# Patient Record
Sex: Female | Born: 1990 | Race: Black or African American | Hispanic: No | Marital: Single | Smoking: Current every day smoker
Health system: Southern US, Community
[De-identification: ages and names within clinical notes are randomized; demographics above are authoritative.]

## PROBLEM LIST (undated history)

## (undated) DIAGNOSIS — Z8742 Personal history of other diseases of the female genital tract: Secondary | ICD-10-CM

## (undated) DIAGNOSIS — Z8619 Personal history of other infectious and parasitic diseases: Secondary | ICD-10-CM

## (undated) HISTORY — DX: Personal history of other infectious and parasitic diseases: Z86.19

## (undated) HISTORY — DX: Personal history of other diseases of the female genital tract: Z87.42

---
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
## 2012-01-03 IMAGING — US US TRANSVAGINAL NON-OB
1 series · 14 of 25 positions shown · non-contrast
Comparison: None

CLINICAL DATA: Left lower quadrant pain.

TRANSABDOMINAL AND TRANSVAGINAL ULTRASOUND OF PELVIS
TECHNIQUE: Both transabdominal and transvaginal ultrasound
examinations of the pelvis were performed including evaluation of
the uterus, ovaries, adnexal regions, and pelvic cul-de-sac.

[Series 1: us transvaginal non-ob · 0.25mm/px · 14 of 60 slices shown]
[im 1/60]
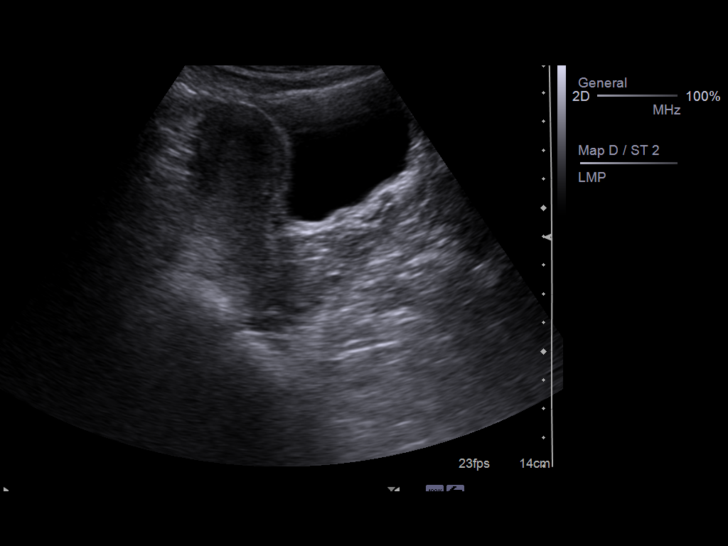
[im 5/60]
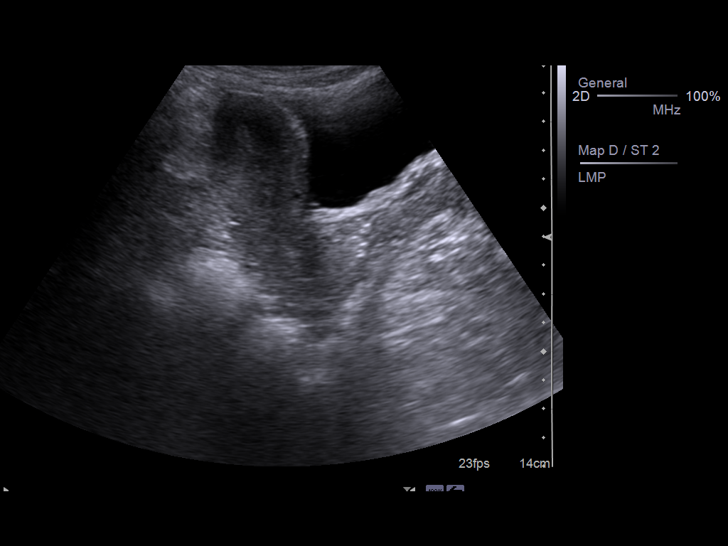
[im 10/60]
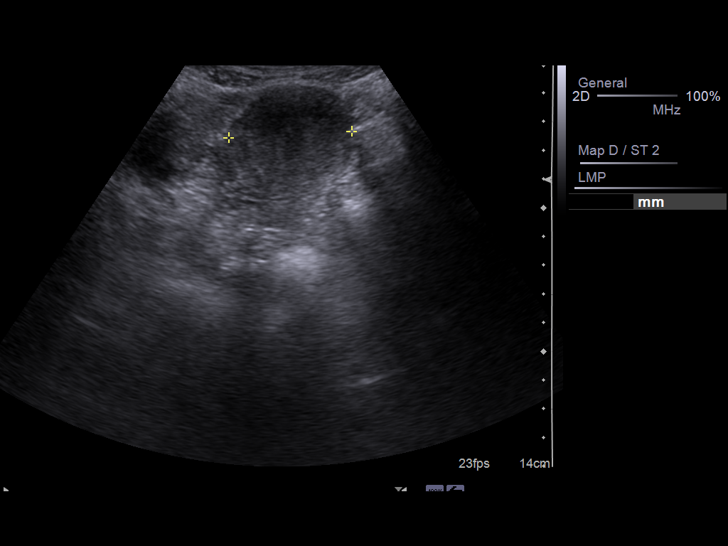
[im 15/60]
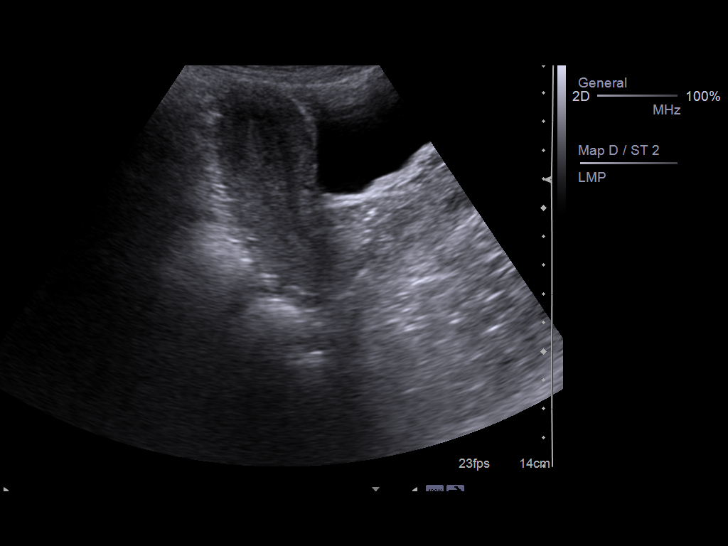
[im 20/60]
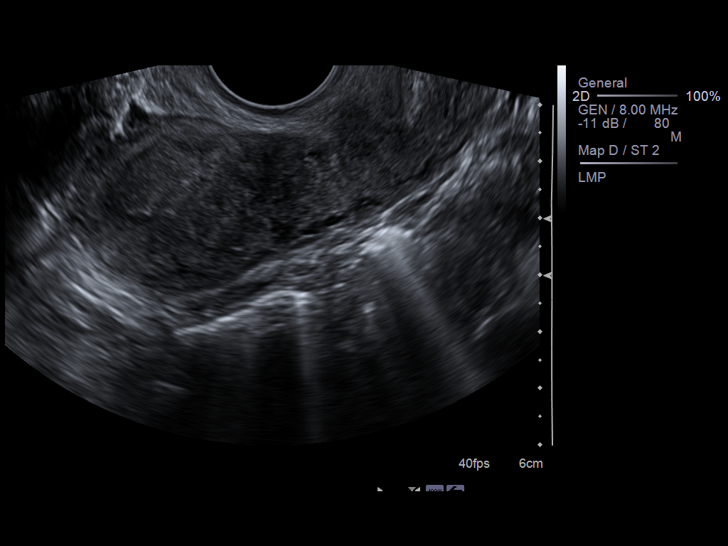
[im 23/60]
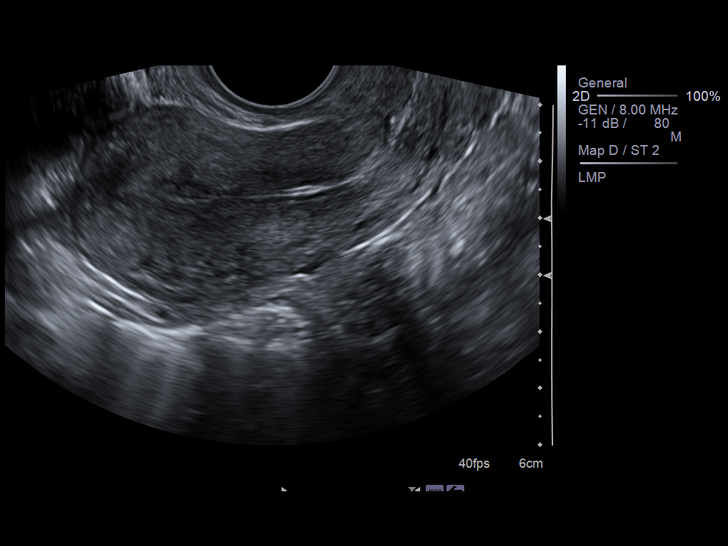
[im 28/60]
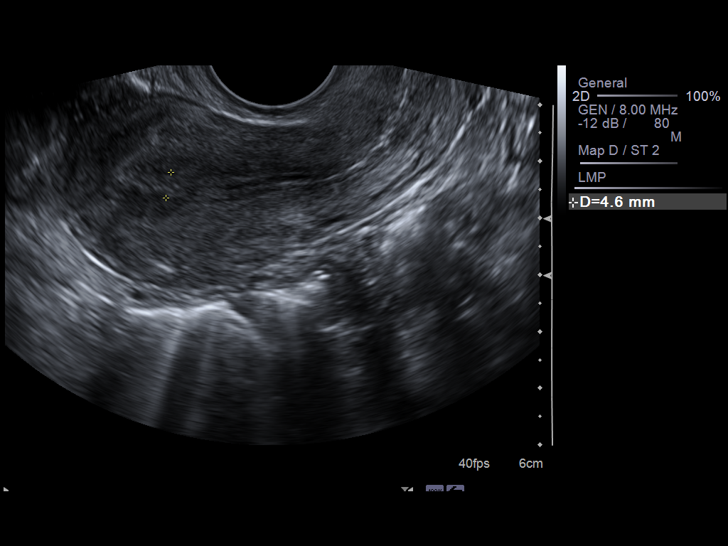
[im 32/60]
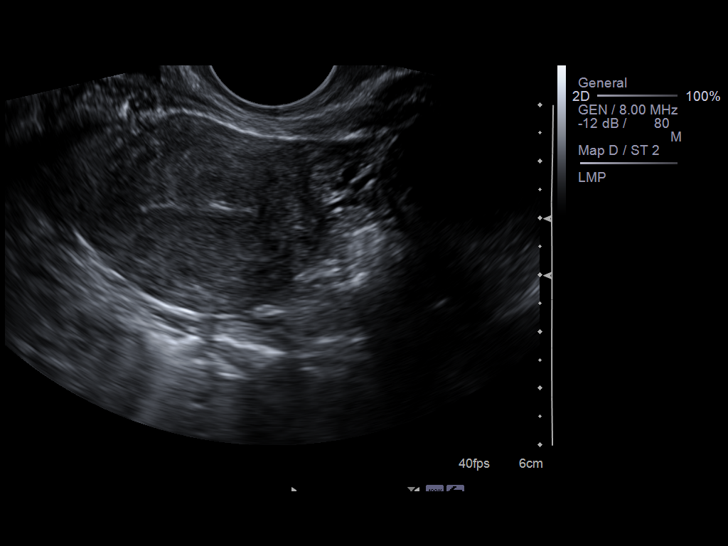
[im 37/60]
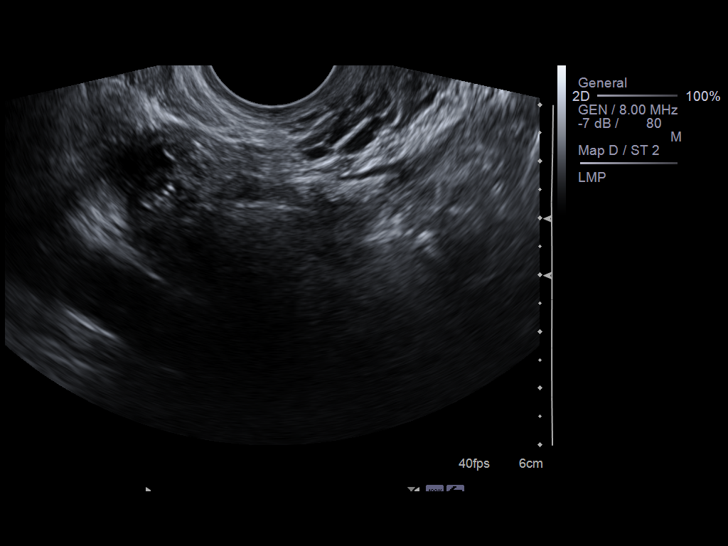
[im 40/60]
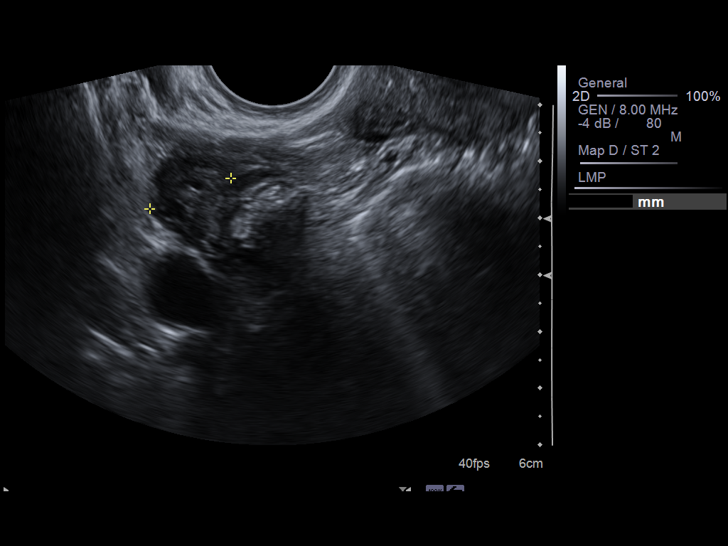
[im 45/60]
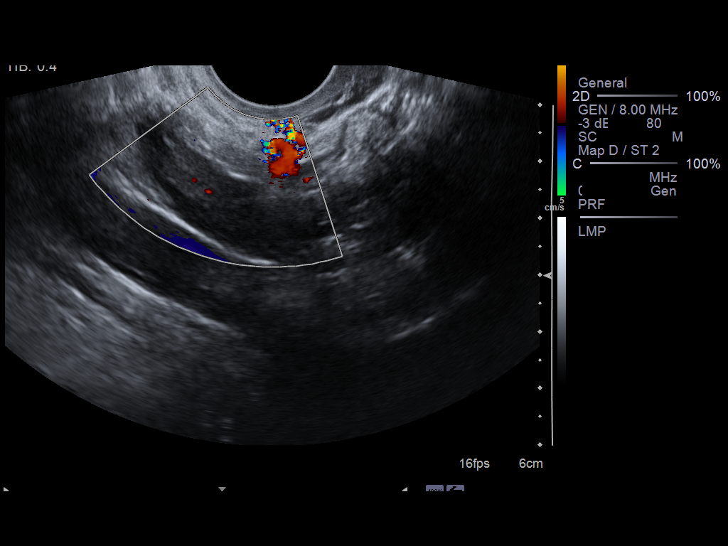
[im 50/60]
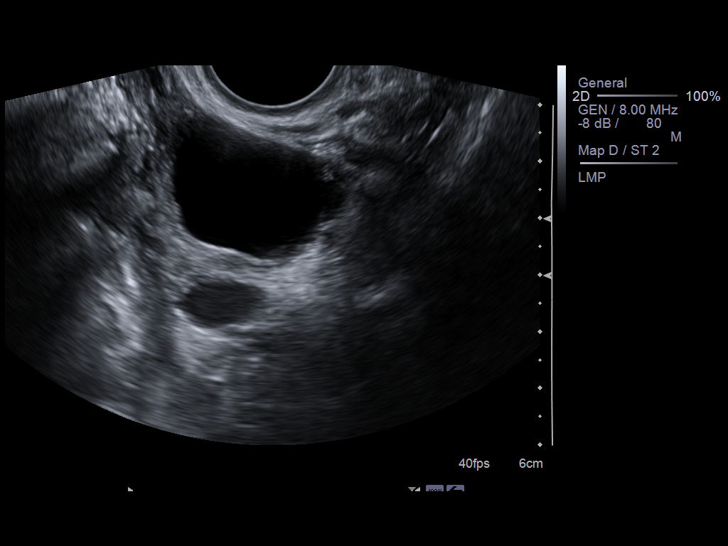
[im 55/60]
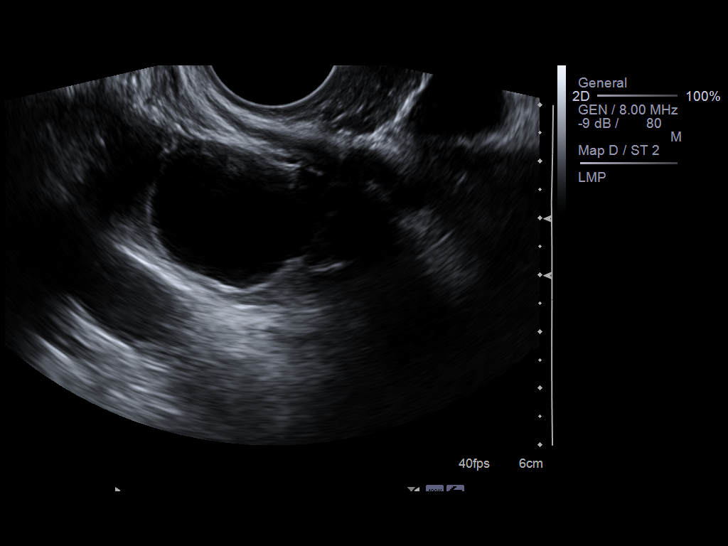
[im 60/60]
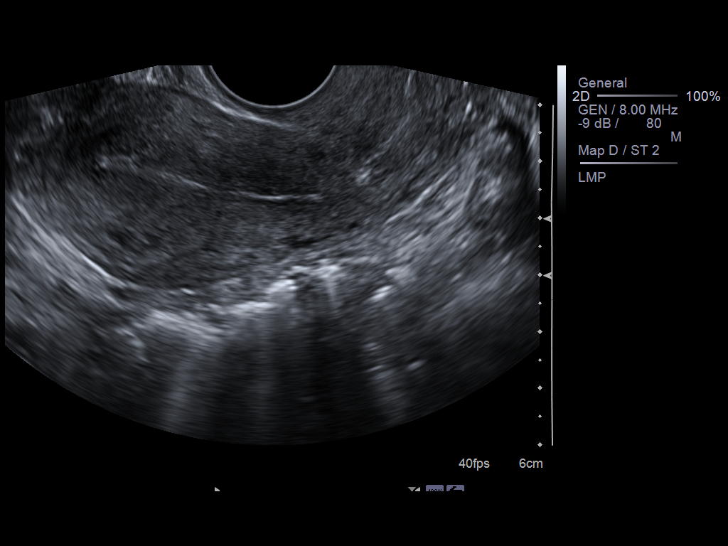

[14 of 25 positions shown; findings below may reference images not displayed]

FINDINGS: Uterus: 7.8 x 3.3 x 4.5 cm.  Normal echotexture.  No focal
abnormality.

Endometrium: Normal in thickness and appearance, 5 mm.

Right Ovary: 2.9 x 1.0 x 1.5 cm. Normal size and echotexture.  No
adnexal masses.

Left Ovary: 3.6 x 2.7 x 3.1 cm.  2.9 cm simple cyst noted in the
left ovary, likely functional cyst.

Other Findings:  Small amount of free fluid in the pelvis.
IMPRESSION: 2.9 cm simple cyst in the left ovary.

## 2015-12-21 ENCOUNTER — Encounter (HOSPITAL_COMMUNITY): Payer: Self-pay | Admitting: Emergency Medicine

## 2015-12-21 ENCOUNTER — Emergency Department (HOSPITAL_COMMUNITY)
Admission: EM | Admit: 2015-12-21 | Discharge: 2015-12-21 | Disposition: A | Discharge status: Home / Self Care | Payer: Medicaid Other | Attending: Emergency Medicine | Admitting: Emergency Medicine

## 2015-12-21 DIAGNOSIS — N764 Abscess of vulva: Secondary | ICD-10-CM | POA: Insufficient documentation

## 2015-12-21 DIAGNOSIS — F17200 Nicotine dependence, unspecified, uncomplicated: Secondary | ICD-10-CM | POA: Insufficient documentation

## 2015-12-21 DIAGNOSIS — L0292 Furuncle, unspecified: Secondary | ICD-10-CM

## 2015-12-21 DIAGNOSIS — N760 Acute vaginitis: Secondary | ICD-10-CM | POA: Insufficient documentation

## 2015-12-21 DIAGNOSIS — J45909 Unspecified asthma, uncomplicated: Secondary | ICD-10-CM | POA: Insufficient documentation

## 2015-12-21 DIAGNOSIS — B9689 Other specified bacterial agents as the cause of diseases classified elsewhere: Secondary | ICD-10-CM

## 2015-12-21 LAB — WET PREP, GENITAL
SPERM: NONE SEEN
TRICH WET PREP: NONE SEEN
WBC, Wet Prep HPF POC: NONE SEEN
Yeast Wet Prep HPF POC: NONE SEEN

## 2015-12-21 MED ORDER — METRONIDAZOLE 500 MG PO TABS: 500.0000 mg | ORAL_TABLET | Freq: Two times a day (BID) | ORAL | 0 refills | Status: DC

## 2015-12-21 NOTE — Discharge Instructions (Signed)
You were seen today for a bump near your vagina. This is likely an inflamed hair follicle. You will need to apply a warm compress/wash cloth to the area at least three times a day for 10 minutes at a time. The bump should eventually come to a head and pop like a pimple or it will just go away over the next week. You can take over the counter Tylenol as needed for pain. If the bump becomes bigger or more painful you will need to be seen at the health department or here.   We saw that you had bacterial vaginosis on your test. We have given you a 1 week treatment with Metronidazole. This is NOT an STD.    Take care!

## 2015-12-21 NOTE — ED Provider Notes (Signed)
I saw and evaluated the patient, reviewed the resident's note and I agree with the findings and plan with the following exceptions.   25 year old female with vaginal discharge even after being treated for 2 days for bacterial vaginosis. Also has a painful area right lower labial area. This consistent with folliculitis no evidence of cellulitis or abscess at this time. She will use compresses and topical treatment. We will also give her a longer course of Flagyl and have her follow-up with health Department this is an improvement to make sure is not something like herpes.   Marily MemosJason Najee Manninen, MD 12/21/15 [phone]

## 2015-12-21 NOTE — ED Triage Notes (Signed)
Pt sts 2 "knots" in her vaginal area that are painful; pt sts recent meds for BV

## 2015-12-21 NOTE — ED Provider Notes (Signed)
[facility]-EMERGENCY DEPT Provider Note   CSN: [id number] Arrival date & time: 12/21/15  [id number]   History   Chief Complaint Chief Complaint  Patient presents with  . Vaginal Pain   HPI   Christina Barber is a 25 y.o. female with PMH of asthma who presents with vaginal pain. Patient states that 2 weeks ago she had a bump on the outside of her vagina. The bump went away but then came back 1 week later. Then another bump popped up near the previous one a couple days later. She now endorses 2 bumps in the vaginal area. Has not been able to visualize them but notes no drainage from the site or blood. She has never had something like this happen before. Admits to using nair hair removal cream on her vagina intermittently. She also shaves her vagina as needed. No new soaps or douching.   Additionally, she admits that she was treated for BV, gonorrhea, and chlamydia [facility] although her tests only came back positive for BV. States she took a 2 day antibiotic course for the BV, does not remember the name of the medication. The last time she had sexual intercourse was about 2 months ago. Admits to using condoms. Only has 1 female partner. Has had "yellowish green" vaginal discharge over the last week. Denies any fevers, chills, pelvic pain, back pain, or dysuria.   Has had a cold for the last week with runny nose and cough. Denies any sore throat.   History reviewed. No pertinent past medical history.  Patient Active Problem List   Diagnosis Date Noted  . Missed period 05/18/2010  . ASTHMA, EXERCISE INDUCED 04/03/2006  . HEMATURIA 04/03/2006    History reviewed. No pertinent surgical history.  OB History    No data available      Home Medications    Prior to Admission medications   Medication Sig Start Date End Date Taking? Authorizing Provider  drospirenone-ethinyl estradiol (YASMIN) 3-0.03 MG per tablet Take 1 tablet by mouth daily.      Historical Provider, MD    metroNIDAZOLE (FLAGYL) 500 MG tablet Take 1 tablet (500 mg total) by mouth 2 (two) times daily. 12/21/15   Beaulah Dinninghristina M Jammy Stlouis, MD    Family History History reviewed. No pertinent family history.  Social History Social History  Substance Use Topics  . Smoking status: Current Every Day Smoker  . Smokeless tobacco: Never Used  . Alcohol use No     Allergies   Patient has no known allergies.   Review of Systems Review of Systems  10 Systems reviewed and are negative for acute change except as noted in the HPI.  Physical Exam Updated Vital Signs BP 131/93   Pulse 94   Temp 99.3 F (37.4 C) (Oral)   Resp 16   SpO2 100%   Physical Exam  Constitutional: She is oriented to person, place, and time. She appears well-developed and well-nourished.  HENT:  Head: Normocephalic and atraumatic.  Right Ear: External ear normal.  Left Ear: External ear normal.  Nose: Mucosal edema (bilaterally) present.  Mouth/Throat: Oropharynx is clear and moist.  Eyes: Conjunctivae and EOM are normal. Pupils are equal, round, and reactive to light. No scleral icterus.  Neck: Normal range of motion. Neck supple.  Cardiovascular: Normal rate, regular rhythm, normal heart sounds and intact distal pulses.   Pulmonary/Chest: Effort normal and breath sounds normal. No respiratory distress. She has no wheezes.  Intermittent coughing  Abdominal: Soft.  Bowel sounds are normal. She exhibits no distension. There is no tenderness.  Genitourinary: Rectum normal.     Genitourinary Comments: External genitalia within normal limits.  Vaginal mucosa pink, moist, normal rugae. Nonfriable cervix without lesions, no bleeding noted on speculum exam. Some white creamy discharge noted in vaginal vault. Bimanual exam revealed normal, nongravid uterus.  No cervical motion tenderness. No adnexal masses bilaterally.     Musculoskeletal: Normal range of motion. She exhibits no edema or tenderness.  Lymphadenopathy:     She has no cervical adenopathy.  Neurological: She is alert and oriented to person, place, and time.  Skin: Skin is warm and dry. Capillary refill takes less than 2 seconds.  Psychiatric: She has a normal mood and affect. Her behavior is normal.     ED Treatments / Results  Labs (all labs ordered are listed, but only abnormal results are displayed) Labs Reviewed  WET PREP, GENITAL - Abnormal; Notable for the following:       Result Value   Clue Cells Wet Prep HPF POC PRESENT (*)    All other components within normal limits  GC[facility]CHLAMYDIA [facility] AMP [facility]    EKG  EKG Interpretation None       Radiology No results found.  Procedures Procedures (including critical care time)  Medications Ordered in ED Medications - No data to display   Initial Impression / Assessment and Plan / ED Course  I have reviewed the triage vital signs and the nursing notes.  Pertinent labs & imaging results that were available during my care of the patient were reviewed by me and considered in my medical decision making (see chart for details).  Clinical Course    Christina HippJessica R Brar is a 25 y.o. female with PMH of asthma who presented with vaginal pain and discharge. Vital signs stable and patient afebrile. Physical exam showing ~802mm furuncle on right groin next to outer labia majora. Vaginal exam showing some white creamy discharge but otherwise normal pelvic exam. Unlikely PID. Wet prep and GC/Chlamydia testing performed. Wet prep revealing clue cells.   Discussed using warm compresses and triple antibiotic ointment on furuncle. Tylenol for pain. If lesion does not improve over the next week, will return to health department.   Will prescribe 7 day course of Metronidazole 500 mg BID for BV treatment.   Stable for discharge.    Final Clinical Impressions(s) / ED Diagnoses   Final diagnoses:  Furuncle  BV (bacterial vaginosis)    New Prescriptions New Prescriptions    METRONIDAZOLE (FLAGYL) 500 MG TABLET    Take 1 tablet (500 mg total) by mouth 2 (two) times daily.     Beaulah Dinninghristina M Linlee Cromie, MD 12/21/15 1055    Beaulah Dinninghristina M Aanshi Batchelder, MD 12/21/15 1101    Marily MemosJason Mesner, MD 12/21/15 [phone]

## 2015-12-22 LAB — GC/CHLAMYDIA PROBE AMP (~~LOC~~) NOT AT ARMC
CHLAMYDIA, DNA PROBE: NEGATIVE
NEISSERIA GONORRHEA: NEGATIVE

## 2016-07-08 ENCOUNTER — Emergency Department
Admission: EM | Admit: 2016-07-08 | Discharge: 2016-07-08 | Disposition: A | Discharge status: Home / Self Care | Payer: Medicaid Other | Attending: Emergency Medicine | Admitting: Emergency Medicine

## 2016-07-08 ENCOUNTER — Encounter: Payer: Self-pay | Admitting: Emergency Medicine

## 2016-07-08 DIAGNOSIS — B3731 Acute candidiasis of vulva and vagina: Secondary | ICD-10-CM

## 2016-07-08 DIAGNOSIS — B9689 Other specified bacterial agents as the cause of diseases classified elsewhere: Secondary | ICD-10-CM

## 2016-07-08 DIAGNOSIS — F17210 Nicotine dependence, cigarettes, uncomplicated: Secondary | ICD-10-CM | POA: Insufficient documentation

## 2016-07-08 DIAGNOSIS — N760 Acute vaginitis: Secondary | ICD-10-CM

## 2016-07-08 DIAGNOSIS — B373 Candidiasis of vulva and vagina: Secondary | ICD-10-CM | POA: Insufficient documentation

## 2016-07-08 DIAGNOSIS — J45909 Unspecified asthma, uncomplicated: Secondary | ICD-10-CM | POA: Insufficient documentation

## 2016-07-08 LAB — URINALYSIS, ROUTINE W REFLEX MICROSCOPIC
BILIRUBIN URINE: NEGATIVE
GLUCOSE, UA: NEGATIVE mg/dL
HGB URINE DIPSTICK: NEGATIVE
KETONES UR: NEGATIVE mg/dL
Leukocytes, UA: NEGATIVE
Nitrite: NEGATIVE
PH: 5 (ref 5.0–8.0)
Protein, ur: NEGATIVE mg/dL
SPECIFIC GRAVITY, URINE: 1.025 (ref 1.005–1.030)

## 2016-07-08 LAB — WET PREP, GENITAL
SPERM: NONE SEEN
TRICH WET PREP: NONE SEEN
YEAST WET PREP: NONE SEEN

## 2016-07-08 LAB — POCT PREGNANCY, URINE: Preg Test, Ur: NEGATIVE

## 2016-07-08 LAB — CHLAMYDIA/NGC RT PCR (ARMC ONLY)
Chlamydia Tr: NOT DETECTED
N gonorrhoeae: NOT DETECTED

## 2016-07-08 MED ORDER — FLUCONAZOLE 150 MG PO TABS: 150.0000 mg | ORAL_TABLET | Freq: Every day | ORAL | 0 refills | Status: DC

## 2016-07-08 MED ORDER — METRONIDAZOLE 500 MG PO TABS: 500.0000 mg | ORAL_TABLET | Freq: Two times a day (BID) | ORAL | 0 refills | Status: DC

## 2016-07-08 MED ORDER — METRONIDAZOLE 500 MG PO TABS
500.0000 mg | ORAL_TABLET | Freq: Once | ORAL | Status: AC
  Administered 2016-07-08: 500 mg via ORAL
  Filled 2016-07-08: qty 1

## 2016-07-08 NOTE — ED Notes (Signed)
See triage note  States she is having some lower abd pain with vaginal itching and discharge

## 2016-07-08 NOTE — ED Triage Notes (Signed)
Vaginal irritation x 2 days. White discharge.

## 2016-07-08 NOTE — ED Provider Notes (Signed)
[facility] Emergency Department Provider Note  ____________________________________________  Time seen: Approximately 6:42 PM  I have reviewed the triage vital signs and the nursing notes.   HISTORY  Chief Complaint Vaginal Itching    HPI Christina Barber is a 26 y.o. female who presents to the emergency department for evaluation of vaginal irritation and white discharge for the past 2 days.She states that her husband just returned from prison and Been having frequent intercourse. She states that she has some pain on the left side of her pelvis during intercourse. She has not used any over-the-counter medications for what she feels is a yeast infection. She states that she frequently gets bacterial vaginosis as well as yeast infections.  History reviewed. No pertinent past medical history.  Patient Active Problem List   Diagnosis Date Noted  . Missed period 05/18/2010  . ASTHMA, EXERCISE INDUCED 04/03/2006  . HEMATURIA 04/03/2006    History reviewed. No pertinent surgical history.  Prior to Admission medications   Medication Sig Start Date End Date Taking? Authorizing Provider  drospirenone-ethinyl estradiol (YASMIN) 3-0.03 MG per tablet Take 1 tablet by mouth daily.      [provider]  fluconazole (DIFLUCAN) 150 MG tablet Take 1 tablet (150 mg total) by mouth daily. 07/08/16   Makylie Rivere B, FNP  metroNIDAZOLE (FLAGYL) 500 MG tablet Take 1 tablet (500 mg total) by mouth 2 (two) times daily. 07/08/16   Chinita Pester, FNP    Allergies Patient has no known allergies.  No family history on file.  Social History Social History  Substance Use Topics  . Smoking status: Current Every Day Smoker    Packs/day: 1.00    Types: Cigarettes  . Smokeless tobacco: Never Used  . Alcohol use No    Review of Systems Constitutional: Negative for fever. Respiratory: Negative for shortness of breath or cough. Gastrointestinal:Negative for abdominal  pain; negative for nausea , negative for vomiting. Genitourinary: Negative for dysuria , positive for vaginal discharge. Positive for dyspareunia.  Musculoskeletal: Negative for back pain. Skin: Negative for rash, lesion, or wound.. ____________________________________________   PHYSICAL EXAM:  VITAL SIGNS: ED Triage Vitals  Enc Vitals Group     BP 07/08/16 1739 119/79     Pulse Rate 07/08/16 1739 76     Resp 07/08/16 1739 18     Temp 07/08/16 1739 98.1 F (36.7 C)     Temp Source 07/08/16 1739 Oral     SpO2 07/08/16 1739 98 %     Weight 07/08/16 1740 130 lb (59 kg)     Height 07/08/16 1740 5\' 5"  (1.651 m)     Head Circumference --      Peak Flow --      Pain Score --      Pain Loc --      Pain Edu? --      Excl. in GC? --     Constitutional: Alert and oriented. Well appearing and in no acute distress. Eyes: Conjunctivae are normal.  Head: Atraumatic. Nose: No congestion/rhinnorhea. Mouth/Throat: Mucous membranes are moist. Respiratory: Normal respiratory effort.  No retractions. Gastrointestinal: Abdomen is soft, nontender. No rebound or guarding. Negative McBurney's. Genitourinary: Pelvic exam: Copious amounts of thick, white discharge noted in the vaginal vault as well as on the exterior surface of the cervix. Yellow discharge noted at the cervical os. Mild left adnexal tenderness on exam. No cervical motion tenderness. No right adnexal tenderness on exam. Musculoskeletal: No extremity tenderness nor edema.  Neurologic:  Normal speech and language. No gross focal neurologic deficits are appreciated. Speech is normal. No gait instability. Skin:  Skin is warm, dry and intact. No rash noted. Psychiatric: Mood and affect are normal. Speech and behavior are normal.  ____________________________________________   LABS (all labs ordered are listed, but only abnormal results are displayed)  Labs Reviewed  WET PREP, GENITAL - Abnormal; Notable for the following:        Result Value   Clue Cells Wet Prep HPF POC PRESENT (*)    WBC, Wet Prep HPF POC RARE (*)    All other components within normal limits  URINALYSIS, ROUTINE W REFLEX MICROSCOPIC - Abnormal; Notable for the following:    Color, Urine YELLOW (*)    APPearance CLEAR (*)    All other components within normal limits  CHLAMYDIA/NGC RT PCR ([facility] ONLY)  POC URINE PREG, ED  POCT PREGNANCY, URINE  POC URINE PREG, ED   ____________________________________________  RADIOLOGY  Not indicated ____________________________________________   PROCEDURES  Procedure(s) performed: None  ____________________________________________  26 year old female presenting to the emergency department for evaluation and treatment of vaginal discharge. Pelvic exam reveals thick, white discharge in the vaginal vault as well as covering the cervix. Patient's significant other is also being evaluated in the emergency department and was treated empirically for chlamydia, gonorrhea, and Trichomonas. I made the recommendation to the patient that she also be treated empirically due to her symptoms, exam, and the fact that he to is here for STD screening. Patient became angry and stated that he did not need empiric treatment and that she only wanted him to be treated for yeast and bacteria. I attempted in many different ways to explain to the patient that it was unlikely that he had a yeast infection or bacterial infection. ED tech witnessed the conversation. Patient became very argumentative and I ended the conversation by telling her that I would return once the results of her wet prep are back.  Results of wet prep reviewed with the patient who will receive treatment for yeast and bacterial vaginosis. Treatment for yeast is based on clinical exam although lab did not report visualization on sample. Patient was advised that she should follow-up with the free STD clinic at the health department for additional testing and  if symptoms are not improving.  INITIAL IMPRESSION / ASSESSMENT AND PLAN / ED COURSE  Pertinent labs & imaging results that were available during my care of the patient were reviewed by me and considered in my medical decision making (see chart for details).  ____________________________________________   FINAL CLINICAL IMPRESSION(S) / ED DIAGNOSES  Final diagnoses:  Yeast vaginitis  Bacterial vaginosis    Note:  This document was prepared using Dragon voice recognition software and may include unintentional dictation errors.    Chinita Pesterriplett, Karrina Lye B, FNP 07/11/16 1926    Phineas SemenGoodman, [place], MD 07/13/16 [phone]

## 2016-08-20 ENCOUNTER — Emergency Department
Admission: EM | Admit: 2016-08-20 | Discharge: 2016-08-20 | Disposition: A | Discharge status: Home / Self Care | Payer: Self-pay | Attending: Emergency Medicine | Admitting: Emergency Medicine

## 2016-08-20 DIAGNOSIS — Z79899 Other long term (current) drug therapy: Secondary | ICD-10-CM | POA: Insufficient documentation

## 2016-08-20 DIAGNOSIS — N3001 Acute cystitis with hematuria: Secondary | ICD-10-CM

## 2016-08-20 DIAGNOSIS — F17210 Nicotine dependence, cigarettes, uncomplicated: Secondary | ICD-10-CM | POA: Insufficient documentation

## 2016-08-20 DIAGNOSIS — N3091 Cystitis, unspecified with hematuria: Secondary | ICD-10-CM | POA: Insufficient documentation

## 2016-08-20 DIAGNOSIS — J45909 Unspecified asthma, uncomplicated: Secondary | ICD-10-CM | POA: Insufficient documentation

## 2016-08-20 LAB — URINALYSIS, COMPLETE (UACMP) WITH MICROSCOPIC
BILIRUBIN URINE: NEGATIVE
Glucose, UA: NEGATIVE mg/dL
KETONES UR: NEGATIVE mg/dL
Nitrite: NEGATIVE
Protein, ur: 100 mg/dL — AB
SPECIFIC GRAVITY, URINE: 1.017 (ref 1.005–1.030)
pH: 6 (ref 5.0–8.0)

## 2016-08-20 LAB — PREGNANCY, URINE: PREG TEST UR: NEGATIVE

## 2016-08-20 MED ORDER — TRAMADOL HCL 50 MG PO TABS
50.0000 mg | ORAL_TABLET | Freq: Once | ORAL | Status: AC
  Administered 2016-08-20: 50 mg via ORAL
  Filled 2016-08-20: qty 1

## 2016-08-20 MED ORDER — KETOROLAC TROMETHAMINE 60 MG/2ML IM SOLN
30.0000 mg | Freq: Once | INTRAMUSCULAR | Status: AC
  Administered 2016-08-20: 30 mg via INTRAMUSCULAR
  Filled 2016-08-20: qty 2

## 2016-08-20 MED ORDER — NAPROXEN 500 MG PO TABS: 500.0000 mg | ORAL_TABLET | Freq: Two times a day (BID) | ORAL | Status: DC

## 2016-08-20 MED ORDER — PHENAZOPYRIDINE HCL 200 MG PO TABS: 200.0000 mg | ORAL_TABLET | Freq: Three times a day (TID) | ORAL | 0 refills | Status: DC | PRN

## 2016-08-20 MED ORDER — SULFAMETHOXAZOLE-TRIMETHOPRIM 800-160 MG PO TABS: 1.0000 | ORAL_TABLET | Freq: Two times a day (BID) | ORAL | 0 refills | Status: DC

## 2016-08-20 NOTE — ED Triage Notes (Signed)
Pt c/o burning/painful urination that started today. "I think I have a UTI"..Marland Kitchen

## 2016-08-20 NOTE — ED Provider Notes (Signed)
[facility] Emergency Department Provider Note   ____________________________________________   First MD Initiated Contact with Patient 08/20/16 1047     (approximate)  I have reviewed the triage vital signs and the nursing notes.   HISTORY  Chief Complaint Urinary Frequency    HPI Christina Barber is a 26 y.o. female patient complain of dysuria that started today. Patient also complaining of frequency and urgency. Patient believes she has a "UTI".Patient denies fever, flank pain, vaginal discharge.  No pulsus measures for complaint. Patient rates the pain as a 6/10. She described a pain as "burning".   History reviewed. No pertinent past medical history.  Patient Active Problem List   Diagnosis Date Noted  . Missed period 05/18/2010  . ASTHMA, EXERCISE INDUCED 04/03/2006  . HEMATURIA 04/03/2006    History reviewed. No pertinent surgical history.  Prior to Admission medications   Medication Sig Start Date End Date Taking? Authorizing Provider  drospirenone-ethinyl estradiol (YASMIN) 3-0.03 MG per tablet Take 1 tablet by mouth daily.      [provider]  fluconazole (DIFLUCAN) 150 MG tablet Take 1 tablet (150 mg total) by mouth daily. 07/08/16   Triplett, Cari B, FNP  metroNIDAZOLE (FLAGYL) 500 MG tablet Take 1 tablet (500 mg total) by mouth 2 (two) times daily. 07/08/16   Triplett, Rulon Eisenmengerari B, FNP  naproxen (NAPROSYN) 500 MG tablet Take 1 tablet (500 mg total) by mouth 2 (two) times daily with a meal. 08/20/16   Joni ReiningSmith, Decie Verne K, PA-C  phenazopyridine (PYRIDIUM) 200 MG tablet Take 1 tablet (200 mg total) by mouth 3 (three) times daily as needed for pain. 08/20/16   Joni ReiningSmith, Kaison Mcparland K, PA-C  sulfamethoxazole-trimethoprim (BACTRIM DS,SEPTRA DS) 800-160 MG tablet Take 1 tablet by mouth 2 (two) times daily. 08/20/16   Joni ReiningSmith, Jamin Humphries K, PA-C    Allergies Patient has no known allergies.  No family history on file.  Social History Social History    Substance Use Topics  . Smoking status: Current Every Day Smoker    Packs/day: 1.00    Types: Cigarettes  . Smokeless tobacco: Never Used  . Alcohol use No    Review of Systems  Constitutional: No fever/chills Eyes: No visual changes. ENT: No sore throat. Cardiovascular: Denies chest pain. Respiratory: Denies shortness of breath. Gastrointestinal: No abdominal pain.  No nausea, no vomiting.  No diarrhea.  No constipation. Genitourinary: Positive for dysuria. Musculoskeletal: Negative for back pain. Skin: Negative for rash. Neurological: Negative for headaches, focal weakness or numbness.   ____________________________________________   PHYSICAL EXAM:  VITAL SIGNS: ED Triage Vitals [08/20/16 1031]  Enc Vitals Group     BP      Pulse      Resp      Temp      Temp src      SpO2      Weight      Height      Head Circumference      Peak Flow      Pain Score 6     Pain Loc      Pain Edu?      Excl. in GC?     Constitutional: Alert and oriented. Well appearing and in no acute distress. Cardiovascular: Normal rate, regular rhythm. Grossly normal heart sounds.  Good peripheral circulation. Respiratory: Normal respiratory effort.  No retractions. Lungs CTAB. Gastrointestinal: Soft and nontender. No distention. No abdominal bruits. No CVA tenderness. Genitourinary: Deferred Neurologic:  Normal speech and language. No gross  focal neurologic deficits are appreciated. No gait instability. Skin:  Skin is warm, dry and intact. No rash noted. Psychiatric: Mood and affect are normal. Speech and behavior are normal.  ____________________________________________   LABS (all labs ordered are listed, but only abnormal results are displayed)  Labs Reviewed  URINALYSIS, COMPLETE (UACMP) WITH MICROSCOPIC - Abnormal; Notable for the following:       Result Value   Color, Urine YELLOW (*)    APPearance CLOUDY (*)    Hgb urine dipstick LARGE (*)    Protein, ur 100 (*)     Leukocytes, UA LARGE (*)    Bacteria, UA MANY (*)    Squamous Epithelial / LPF 0-5 (*)    All other components within normal limits  PREGNANCY, URINE  POC URINE PREG, ED   ____________________________________________  EKG   ____________________________________________  RADIOLOGY  No results found.  ____________________________________________   PROCEDURES  Procedure(s) performed: None  Procedures  Critical Care performed: No  ____________________________________________   INITIAL IMPRESSION / ASSESSMENT AND PLAN / ED COURSE  Pertinent labs & imaging results that were available during my care of the patient were reviewed by me and considered in my medical decision making (see chart for details).  Urinary tract infection. Discussed lab results with patient. Patient given discharge care instructions and a work no. [facility].      ____________________________________________   FINAL CLINICAL IMPRESSION(S) / ED DIAGNOSES  Final diagnoses:  Acute cystitis with hematuria      NEW MEDICATIONS STARTED DURING THIS VISIT:  New Prescriptions   NAPROXEN (NAPROSYN) 500 MG TABLET    Take 1 tablet (500 mg total) by mouth 2 (two) times daily with a meal.   PHENAZOPYRIDINE (PYRIDIUM) 200 MG TABLET    Take 1 tablet (200 mg total) by mouth 3 (three) times daily as needed for pain.   SULFAMETHOXAZOLE-TRIMETHOPRIM (BACTRIM DS,SEPTRA DS) 800-160 MG TABLET    Take 1 tablet by mouth 2 (two) times daily.     Note:  This document was prepared using Dragon voice recognition software and may include unintentional dictation errors.    Joni Reining, PA-C 08/20/16 1130    Rockne Menghini, MD 08/20/16 [phone]

## 2016-08-20 NOTE — ED Notes (Signed)
See triage note  States she developed dysuria and freq with some hematuria today  States she also has had some vaginal discharge yesterday

## 2016-09-05 ENCOUNTER — Encounter: Payer: Self-pay | Admitting: Certified Nurse Midwife

## 2016-09-05 ENCOUNTER — Ambulatory Visit (INDEPENDENT_AMBULATORY_CARE_PROVIDER_SITE_OTHER): Payer: Medicaid Other | Admitting: Certified Nurse Midwife

## 2016-09-05 VITALS — BP 141/90 | HR 79 | Ht 65.0 in | Wt 123.6 lb

## 2016-09-05 DIAGNOSIS — R102 Pelvic and perineal pain: Secondary | ICD-10-CM

## 2016-09-05 DIAGNOSIS — N941 Unspecified dyspareunia: Secondary | ICD-10-CM | POA: Diagnosis not present

## 2016-09-05 DIAGNOSIS — N946 Dysmenorrhea, unspecified: Secondary | ICD-10-CM

## 2016-09-05 DIAGNOSIS — N898 Other specified noninflammatory disorders of vagina: Secondary | ICD-10-CM | POA: Diagnosis not present

## 2016-09-05 LAB — POCT URINALYSIS DIPSTICK
BILIRUBIN UA: NEGATIVE
GLUCOSE UA: NEGATIVE
KETONES UA: NEGATIVE
Leukocytes, UA: NEGATIVE
Nitrite, UA: NEGATIVE
PH UA: 7 (ref 5.0–8.0)
Protein, UA: NEGATIVE
RBC UA: NEGATIVE
Spec Grav, UA: 1.01 (ref 1.010–1.025)
Urobilinogen, UA: 0.2 E.U./dL

## 2016-09-05 MED ORDER — DOXYCYCLINE HYCLATE 100 MG PO CAPS: 100.0000 mg | ORAL_CAPSULE | Freq: Two times a day (BID) | ORAL | 0 refills | Status: DC

## 2016-09-05 MED ORDER — CEFTRIAXONE SODIUM 250 MG IJ SOLR
250.0000 mg | Freq: Once | INTRAMUSCULAR | Status: AC
  Administered 2016-09-05: 250 mg via INTRAMUSCULAR

## 2016-09-05 NOTE — Patient Instructions (Addendum)
Pelvic Inflammatory Disease Pelvic inflammatory disease (PID) is an infection in some or all of the female organs. PID can be in the uterus, ovaries, fallopian tubes, or the surrounding tissues that are inside the lower belly area (pelvis). PID can lead to lasting problems if it is not treated. To check for this disease, your doctor may:  Do a physical exam.  Do blood tests, urine tests, or a pregnancy test.  Look at your vaginal discharge.  Do tests to look inside the pelvis.  Test you for other infections.  Follow these instructions at home:  Take over-the-counter and prescription medicines only as told by your doctor.  If you were prescribed an antibiotic medicine, take it as told by your doctor. Do not stop taking it even if you start to feel better.  Do not have sex until treatment is done or as told by your doctor.  Tell your sex partner if you have PID. Your partner may need to be treated.  Keep all follow-up visits as told by your doctor. This is important.  Your doctor may test you for infection again 3 months after you are treated. Contact a doctor if:  You have more fluid (discharge) coming from your vagina or fluid that is not normal.  Your pain does not improve.  You throw up (vomit).  You have a fever.  You cannot take your medicines.  Your partner has a sexually transmitted disease (STD).  You have pain when you pee (urinate). Get help right away if:  You have more belly (abdominal) or lower belly pain.  You have chills.  You are not better after 72 hours. This information is not intended to replace advice given to you by your health care provider. Make sure you discuss any questions you have with your health care provider. Document Released: 04/19/2008 Document Revised: 06/29/2015 Document Reviewed: 02/28/2014 Elsevier Interactive Patient Education  2018 [employer]. Doxycycline tablets or capsules What is this medicine? DOXYCYCLINE (dox i SYE  kleen) is a tetracycline antibiotic. It kills certain bacteria or stops their growth. It is used to treat many kinds of infections, like dental, skin, respiratory, and urinary tract infections. It also treats acne, Lyme disease, malaria, and certain sexually transmitted infections. This medicine may be used for other purposes; ask your health care provider or pharmacist if you have questions. COMMON BRAND NAME(S): Acticlate, Adoxa, Adoxa CK, Adoxa Pak, Adoxa TT, Alodox, Avidoxy, Doxal, Mondoxyne NL, Monodox, Morgidox 1x, Morgidox 1x Kit, Morgidox 2x, Morgidox 2x Kit, NutriDox, Ocudox, TARGADOX, Vibra-Tabs, Vibramycin What should I tell my health care provider before I take this medicine? They need to know if you have any of these conditions: -liver disease -long exposure to sunlight like working outdoors -stomach problems like colitis -an unusual or allergic reaction to doxycycline, tetracycline antibiotics, other medicines, foods, dyes, or preservatives -pregnant or trying to get pregnant -breast-feeding How should I use this medicine? Take this medicine by mouth with a full glass of water. Follow the directions on the prescription label. It is best to take this medicine without food, but if it upsets your stomach take it with food. Take your medicine at regular intervals. Do not take your medicine more often than directed. Take all of your medicine as directed even if you think you are better. Do not skip doses or stop your medicine early. Talk to your pediatrician regarding the use of this medicine in children. While this drug may be prescribed for selected conditions, precautions do apply. Overdosage:  If you think you have taken too much of this medicine contact a poison control center or emergency room at once. NOTE: This medicine is only for you. Do not share this medicine with others. What if I miss a dose? If you miss a dose, take it as soon as you can. If it is almost time for your next  dose, take only that dose. Do not take double or extra doses. What may interact with this medicine? -antacids -barbiturates -birth control pills -bismuth subsalicylate -carbamazepine -methoxyflurane -other antibiotics -phenytoin -vitamins that contain iron -warfarin This list may not describe all possible interactions. Give your health care provider a list of all the medicines, herbs, non-prescription drugs, or dietary supplements you use. Also tell them if you smoke, drink alcohol, or use illegal drugs. Some items may interact with your medicine. What should I watch for while using this medicine? Tell your doctor or health care professional if your symptoms do not improve. Do not treat diarrhea with over the counter products. Contact your doctor if you have diarrhea that lasts more than 2 days or if it is severe and watery. Do not take this medicine just before going to bed. It may not dissolve properly when you lay down and can cause pain in your throat. Drink plenty of fluids while taking this medicine to also help reduce irritation in your throat. This medicine can make you more sensitive to the sun. Keep out of the sun. If you cannot avoid being in the sun, wear protective clothing and use sunscreen. Do not use sun lamps or tanning beds/booths. Birth control pills may not work properly while you are taking this medicine. Talk to your doctor about using an extra method of birth control. If you are being treated for a sexually transmitted infection, avoid sexual contact until you have finished your treatment. Your sexual partner may also need treatment. Avoid antacids, aluminum, calcium, magnesium, and iron products for 4 hours before and 2 hours after taking a dose of this medicine. If you are using this medicine to prevent malaria, you should still protect yourself from contact with mosquitos. Stay in screened-in areas, use mosquito nets, keep your body covered, and use an insect  repellent. What side effects may I notice from receiving this medicine? Side effects that you should report to your doctor or health care professional as soon as possible: -allergic reactions like skin rash, itching or hives, swelling of the face, lips, or tongue -difficulty breathing -fever -itching in the rectal or genital area -pain on swallowing -redness, blistering, peeling or loosening of the skin, including inside the mouth -severe stomach pain or cramps -unusual bleeding or bruising -unusually weak or tired -yellowing of the eyes or skin Side effects that usually do not require medical attention (report to your doctor or health care professional if they continue or are bothersome): -diarrhea -loss of appetite -nausea, vomiting This list may not describe all possible side effects. Call your doctor for medical advice about side effects. You may report side effects to FDA at 1-800-FDA-1088. Where should I keep my medicine? Keep out of the reach of children. Store at room temperature, below 30 degrees C (86 degrees F). Protect from light. Keep container tightly closed. Throw away any unused medicine after the expiration date. Taking this medicine after the expiration date can make you seriously ill. NOTE: This sheet is a summary. It may not cover all possible information. If you have questions about this medicine, talk to your doctor, pharmacist,  or health care provider.  2018 Elsevier/Gold Standard (2015-02-22 17:11:22) Ceftriaxone injection What is this medicine? CEFTRIAXONE (sef try AX one) is a cephalosporin antibiotic. It is used to treat certain kinds of bacterial infections. It will not work for colds, flu, or other viral infections. This medicine may be used for other purposes; ask your health care provider or pharmacist if you have questions. COMMON BRAND NAME(S): Rocephin What should I tell my health care provider before I take this medicine? They need to know if you have  any of these conditions: -any chronic illness -bowel disease, like colitis -both kidney and liver disease -high bilirubin level in newborn patients -an unusual or allergic reaction to ceftriaxone, other cephalosporin or penicillin antibiotics, foods, dyes, or preservatives -pregnant or trying to get pregnant -breast-feeding How should I use this medicine? This medicine is injected into a muscle or infused it into a vein. It is usually given in a medical office or clinic. If you are to give this medicine you will be taught how to inject it. Follow instructions carefully. Use your doses at regular intervals. Do not take your medicine more often than directed. Do not skip doses or stop your medicine early even if you feel better. Do not stop taking except on your doctor's advice. Talk to your pediatrician regarding the use of this medicine in children. Special care may be needed. Overdosage: If you think you have taken too much of this medicine contact a poison control center or emergency room at once. NOTE: This medicine is only for you. Do not share this medicine with others. What if I miss a dose? If you miss a dose, take it as soon as you can. If it is almost time for your next dose, take only that dose. Do not take double or extra doses. What may interact with this medicine? Do not take this medicine with any of the following medications: -intravenous calcium This medicine may also interact with the following medications: -birth control pills This list may not describe all possible interactions. Give your health care provider a list of all the medicines, herbs, non-prescription drugs, or dietary supplements you use. Also tell them if you smoke, drink alcohol, or use illegal drugs. Some items may interact with your medicine. What should I watch for while using this medicine? Tell your doctor or health care professional if your symptoms do not improve or if they get worse. Do not treat  diarrhea with over the counter products. Contact your doctor if you have diarrhea that lasts more than 2 days or if it is severe and watery. If you are being treated for a sexually transmitted disease, avoid sexual contact until you have finished your treatment. Having sex can infect your sexual partner. Calcium may bind to this medicine and cause lung or kidney problems. Avoid calcium products while taking this medicine and for 48 hours after taking the last dose of this medicine. What side effects may I notice from receiving this medicine? Side effects that you should report to your doctor or health care professional as soon as possible: -allergic reactions like skin rash, itching or hives, swelling of the face, lips, or tongue -breathing problems -fever, chills -irregular heartbeat -pain when passing urine -seizures -stomach pain, cramps -unusual bleeding, bruising -unusually weak or tired Side effects that usually do not require medical attention (report to your doctor or health care professional if they continue or are bothersome): -diarrhea -dizzy, drowsy -headache -nausea, vomiting -pain, swelling, irritation where injected -stomach  upset -sweating This list may not describe all possible side effects. Call your doctor for medical advice about side effects. You may report side effects to FDA at 1-800-FDA-1088. Where should I keep my medicine? Keep out of the reach of children. Store at room temperature below 25 degrees C (77 degrees F). Protect from light. Throw away any unused vials after the expiration date. NOTE: This sheet is a summary. It may not cover all possible information. If you have questions about this medicine, talk to your doctor, pharmacist, or health care provider.  2018 Elsevier/Gold Standard (2013-08-09 09:14:54)

## 2016-09-08 LAB — URINE CULTURE

## 2016-09-09 NOTE — Progress Notes (Signed)
GYN ENCOUNTER NOTE  Subjective:       Christina Barber is a 26 y.o. 241P1001 female here for gynecologic evaluation of vaginal discharge and pelvic pain for the last few months.   Reports white, smelly vaginal discharge. Treated for BV and yeast over the last two (2) months. Also reports dysmenorrhea, dyspareunia, and pelvic pain.   History positive for STIs requiring treatment. Questions PID after Internet research.   Declines STI screening. Denies difficulty breathing or respiratory distress, chest pain, abdominal pain, vaginal bleeding, and leg pain or swelling.   Menstrual History  Period Cycle (Days): 28 Period Duration (Days): Five (5) Period Pattern: Regular Menstrual Flow: Moderate Menstrual Control: Tampon, Maxi pad Menstrual Control Change Freq (Hours): Eight (8) Dysmenorrhea: (!) Moderate Dysmenorrhea Symptoms: Cramping, Nausea, Headache, Other (Comment) (back pain, mood changes)   Gynecologic History  Patient's last menstrual period was 08/25/2016 (exact date).  Contraception: none  Obstetric History  OB History  Gravida Para Term Preterm AB Living  1 1 1     1   SAB TAB Ectopic Multiple Live Births          1    # Outcome Date GA Lbr Len/2nd Weight Sex Delivery Anes PTL Lv  1 Term 05/06/08 5853w3d  8 lb 4.8 oz (3.765 kg) M Vag-Spont  N LIV      Past Medical History:  Diagnosis Date  . Hx of abnormal cervical Pap smear   . Hx of chlamydia infection     History reviewed. No pertinent surgical history.  Current Outpatient Prescriptions on File Prior to Visit  Medication Sig Dispense Refill  . naproxen (NAPROSYN) 500 MG tablet Take 1 tablet (500 mg total) by mouth 2 (two) times daily with a meal. 20 tablet 00  . drospirenone-ethinyl estradiol (YASMIN) 3-0.03 MG per tablet Take 1 tablet by mouth daily.      . fluconazole (DIFLUCAN) 150 MG tablet Take 1 tablet (150 mg total) by mouth daily. (Patient not taking: Reported on 09/05/2016) 1 tablet 0  . metroNIDAZOLE  (FLAGYL) 500 MG tablet Take 1 tablet (500 mg total) by mouth 2 (two) times daily. (Patient not taking: Reported on 09/05/2016) 14 tablet 0  . phenazopyridine (PYRIDIUM) 200 MG tablet Take 1 tablet (200 mg total) by mouth 3 (three) times daily as needed for pain. (Patient not taking: Reported on 09/05/2016) 6 tablet 0  . sulfamethoxazole-trimethoprim (BACTRIM DS,SEPTRA DS) 800-160 MG tablet Take 1 tablet by mouth 2 (two) times daily. (Patient not taking: Reported on 09/05/2016) 20 tablet 0   No current facility-administered medications on file prior to visit.     Allergies  Allergen Reactions  . Latex Hives    Social History   Social History  . Marital status: Single    Spouse name: N/A  . Number of children: N/A  . Years of education: N/A   Occupational History  . Not on file.   Social History Main Topics  . Smoking status: Current Every Day Smoker    Packs/day: 1.00    Types: Cigarettes  . Smokeless tobacco: Never Used  . Alcohol use No  . Drug use: No  . Sexual activity: Yes    Partners: Male    Birth control/ protection: None   Other Topics Concern  . Not on file   Social History Narrative  . No narrative on file    Family History  Problem Relation Age of Onset  . Asthma Mother   . Migraines Mother   .  Rashes / Skin problems Mother   . Diabetes Maternal Grandmother   . Hyperlipidemia Maternal Grandmother   . Hypertension Maternal Grandmother   . Thyroid disease Maternal Grandmother     The following portions of the patient's history were reviewed and updated as appropriate: allergies, current medications, past family history, past medical history, past social history, past surgical history and problem list.  Review of Systems  Review of Systems - Negative except as noted above.  History obtained from the patient.   Objective:   BP (!) 141/90 Comment: left  Pulse 79   Ht 5\' 5"  (1.651 m)   Wt 123 lb 9.6 oz (56.1 kg)   LMP 08/25/2016 (Exact Date)   BMI  20.57 kg/m   CONSTITUTIONAL: Well-developed, well-nourished female in no acute distress.   HENT:  Normocephalic, atraumatic.    SKIN: Skin is warm and dry. No rash noted. Not diaphoretic. No erythema. No pallor.  NEUROLGIC: Alert and oriented to person, place, and time.   PSYCHIATRIC: Normal mood and affect. Normal behavior. Normal judgment and thought content.  ABDOMEN: Soft, non distended; Non tender.  No Organomegaly.  PELVIC:  External Genitalia: Normal  Vagina: Normal  Cervix: CMT present  Uterus: Normal size, shape,consistency, mobile  Adnexa: Normal   MUSCULOSKELETAL: Normal range of motion. No tenderness.  No cyanosis, clubbing, or edema.  Assessment:   1. Dyspareunia in female  - NuSwab Vaginitis Plus (VG+) - Urine Culture - US Transvaginal Non-OB; Future  2. Pelvic pain  - NuSwab Vaginitis Plus (VG+) - Urine Culture - US Transvaginal Non-OB; Future - POCT urinalysis dipstick  3. Vaginal discharge  - NuSwab Vaginitis Plus (VG+)  4. Dysmenorrhea  - NuSwab Vaginitis Plus (VG+)   Plan:   Education regarding PID symptoms and treatment.   Rx Doxycycline and Rocephin, see orders.   Reviewed red flag symptoms and when to call.   RTC x 1-2 weeks for ultrasound and results review.    Gunnar Bulla, CNM

## 2016-09-12 LAB — NUSWAB VAGINITIS PLUS (VG+)
CHLAMYDIA TRACHOMATIS, NAA: NEGATIVE
Candida albicans, NAA: NEGATIVE
Candida glabrata, NAA: NEGATIVE
NEISSERIA GONORRHOEAE, NAA: NEGATIVE
Trich vag by NAA: NEGATIVE

## 2016-09-16 ENCOUNTER — Ambulatory Visit (INDEPENDENT_AMBULATORY_CARE_PROVIDER_SITE_OTHER): Payer: Medicaid Other

## 2016-09-16 DIAGNOSIS — R102 Pelvic and perineal pain: Secondary | ICD-10-CM

## 2016-09-16 DIAGNOSIS — N941 Unspecified dyspareunia: Secondary | ICD-10-CM

## 2016-09-23 ENCOUNTER — Encounter: Payer: Medicaid Other | Admitting: Certified Nurse Midwife

## 2016-09-26 ENCOUNTER — Encounter: Payer: Medicaid Other | Admitting: Certified Nurse Midwife

## 2016-09-30 ENCOUNTER — Encounter: Payer: Self-pay | Admitting: Certified Nurse Midwife

## 2016-09-30 ENCOUNTER — Ambulatory Visit (INDEPENDENT_AMBULATORY_CARE_PROVIDER_SITE_OTHER): Payer: Medicaid Other | Admitting: Certified Nurse Midwife

## 2016-09-30 VITALS — BP 135/89 | HR 71 | Ht 65.0 in | Wt 127.5 lb

## 2016-09-30 DIAGNOSIS — N83299 Other ovarian cyst, unspecified side: Secondary | ICD-10-CM

## 2016-09-30 DIAGNOSIS — L818 Other specified disorders of pigmentation: Secondary | ICD-10-CM

## 2016-09-30 DIAGNOSIS — Z09 Encounter for follow-up examination after completed treatment for conditions other than malignant neoplasm: Secondary | ICD-10-CM | POA: Diagnosis not present

## 2016-09-30 NOTE — Progress Notes (Signed)
GYN ENCOUNTER NOTE  Subjective:       Christina Barber is a 26 y.o. G3P1001 female here for results review and follow up visit.   Reports persistent pain with movement and intercourse.   Patient was originally seen by myself on 09/05/2016 for dyspareunia, dysmenorrhea, pelvic pain, and vaginal discharge.   Requests Hepatitis C testing today due to history of unprofessional tattoos.   Denies difficulty breathing or respiratory distress, chest pain, dysuria, and leg pain or swelling.    Gynecologic History  Patient's last menstrual period was 09/23/2016 (exact date).  Contraception: none  Last Pap: Overdue.   Obstetric History  OB History  Gravida Para Term Preterm AB Living  1 1 1     1   SAB TAB Ectopic Multiple Live Births          1    # Outcome Date GA Lbr Len/2nd Weight Sex Delivery Anes PTL Lv  1 Term 05/06/08 100w3d  8 lb 4.8 oz (3.765 kg) M Vag-Spont  N LIV      Past Medical History:  Diagnosis Date  . Hx of abnormal cervical Pap smear   . Hx of chlamydia infection     History reviewed. No pertinent surgical history.  No current outpatient prescriptions on file prior to visit.   No current facility-administered medications on file prior to visit.     Allergies  Allergen Reactions  . Latex Hives    Social History   Social History  . Marital status: Single    Spouse name: N/A  . Number of children: N/A  . Years of education: N/A   Occupational History  . Not on file.   Social History Main Topics  . Smoking status: Current Every Day Smoker    Packs/day: 1.00    Types: Cigarettes  . Smokeless tobacco: Never Used  . Alcohol use No  . Drug use: No  . Sexual activity: Yes    Partners: Male    Birth control/ protection: None   Other Topics Concern  . Not on file   Social History Narrative  . No narrative on file    Family History  Problem Relation Age of Onset  . Asthma Mother   . Migraines Mother   . Rashes / Skin problems Mother   .  Diabetes Maternal Grandmother   . Hyperlipidemia Maternal Grandmother   . Hypertension Maternal Grandmother   . Thyroid disease Maternal Grandmother     The following portions of the patient's history were reviewed and updated as appropriate: allergies, current medications, past family history, past medical history, past social history, past surgical history and problem list.  Review of Systems  Review of Systems - Negative except as noted above.  History obtained from the patient.   Objective:   BP 135/89   Pulse 71   Ht 5\' 5"  (1.651 m)   Wt 127 lb 8 oz (57.8 kg)   LMP 09/23/2016 (Exact Date)   BMI 21.22 kg/m   Alert and oriented x 4, no apparent distress.   ULTRASOUND REPORT  Location: [facility] Date of Service: 09/16/16   Indications: Pelvic pain and Dyspareunia Findings:  The uterus is anteverted and  measures 9.6 x 4.6 x 5.9 cm. Echo texture is overall without evidence of focal masses, except for a small area on the anterior fundus. At the anterior uterine fundus, there is a suspected fibroid measuring: Fibroid 1: 1.0 x 0.7 x 1.1 cm. This is subserosal.  The Endometrium  is thickened and measures 11.0 mm.  Right Ovary measures 4.2 x 3.6 x 4.0 cm. There is a complex mass that is partly cystic and partly solid. There is flow seen in the solid component of the mass.   Left Ovary measures 3.7 x 2.1 x 2.6 cm. It appears to be WNL. Survey of the adnexa demonstrates no adnexal masses. There is no free fluid in the cul de sac.  Impression: 1. Anterior fundal fibroid, subserosal.  2. Right complex ovarian mass, cystic with solid components with internal blood flow seen. (Patient states that she has just finished antibiotics this past Friday, 09/13/16, for P.I.D. )   Recommendations: 1.Clinical correlation with the patient's History and Physical Exam. 2. Patient has a follow up appointment on 09/26/16 at 4:00 pm with Serafina Royals,  CNM.    Assessment:   1. Complex ovarian cyst  2. Follow up  3. History of being tatooed  Plan:   Discussed cyst management options including expectant management, OCPs, and referral to MD. Decision for MD referral.   Reviewed red flag symptoms and when to call.   RTC x 2 weeks for follow up with MD.    Gunnar Bulla, CNM

## 2016-09-30 NOTE — Patient Instructions (Signed)
Ovarian Cyst °An ovarian cyst is a fluid-filled sac on an ovary. The ovaries are organs that make eggs in women. Most ovarian cysts go away on their own and are not cancerous (are benign). Some cysts need treatment. °Follow these instructions at home: °· Take over-the-counter and prescription medicines only as told by your doctor. °· Do not drive or use heavy machinery while taking prescription pain medicine. °· Get pelvic exams and Pap tests as often as told by your doctor. °· Return to your normal activities as told by your doctor. Ask your doctor what activities are safe for you. °· Do not use any products that contain nicotine or tobacco, such as cigarettes and e-cigarettes. If you need help quitting, ask your doctor. °· Keep all follow-up visits as told by your doctor. This is important. °Contact a doctor if: °· Your periods are: °¨ Late. °¨ Irregular. °¨ Painful. °· Your periods stop. °· You have pelvic pain that does not go away. °· You have pressure on your bladder. °· You have trouble making your bladder empty when you pee (urinate). °· You have pain during sex. °· You have any of the following in your belly (abdomen): °¨ A feeling of fullness. °¨ Pressure. °¨ Discomfort. °¨ Pain that does not go away. °¨ Swelling. °· You feel sick most of the time. °· You have trouble pooping (have constipation). °· You are not as hungry as usual (you lose your appetite). °· You get very bad acne. °· You start to have more hair on your body and face. °· You are gaining weight or losing weight without changing your exercise and eating habits. °· You think you may be pregnant. °Get help right away if: °· You have belly pain that is very bad or gets worse. °· You cannot eat or drink without throwing up (vomiting). °· You suddenly get a fever. °· Your period is a lot heavier than usual. °This information is not intended to replace advice given to you by your health care provider. Make sure you discuss any questions you have  with your health care provider. °Document Released: 07/10/2007 Document Revised: 08/11/2015 Document Reviewed: 06/25/2015 °Elsevier Interactive Patient Education © 2017 [employer] ° °

## 2016-10-02 LAB — CBC
Hematocrit: 38.6 % (ref 34.0–46.6)
Hemoglobin: 12 g/dL (ref 11.1–15.9)
MCH: 25.3 pg — AB (ref 26.6–33.0)
MCHC: 31.1 g/dL — AB (ref 31.5–35.7)
MCV: 81 fL (ref 79–97)
Platelets: 244 10*3/uL (ref 150–379)
RBC: 4.74 x10E6/uL (ref 3.77–5.28)
RDW: 15.4 % (ref 12.3–15.4)
WBC: 3.9 10*3/uL (ref 3.4–10.8)

## 2016-10-02 LAB — HEPATITIS C ANTIBODY

## 2016-10-08 ENCOUNTER — Encounter: Payer: Medicaid Other | Admitting: Obstetrics and Gynecology

## 2016-10-10 ENCOUNTER — Encounter: Payer: Medicaid Other | Admitting: Obstetrics and Gynecology

## 2016-10-11 ENCOUNTER — Encounter: Payer: Self-pay | Admitting: Obstetrics and Gynecology

## 2016-10-11 ENCOUNTER — Ambulatory Visit (INDEPENDENT_AMBULATORY_CARE_PROVIDER_SITE_OTHER): Payer: Medicaid Other | Admitting: Obstetrics and Gynecology

## 2016-10-11 VITALS — BP 124/84 | HR 73 | Ht 65.0 in | Wt 122.0 lb

## 2016-10-11 DIAGNOSIS — R102 Pelvic and perineal pain: Secondary | ICD-10-CM

## 2016-10-11 DIAGNOSIS — N83299 Other ovarian cyst, unspecified side: Secondary | ICD-10-CM

## 2016-10-11 NOTE — Progress Notes (Signed)
HPI:      Christina Barber is a 26 y.o. G1P1001 who LMP was Patient's last menstrual period was 09/23/2016 (exact date).  Subjective:   She presents today To discuss her chronic pelvic pain. She states that she has been having pain over several years and it is getting worse and worse. It is mainly pain with intercourse and mainly right sided. She recently had an ultrasound which revealed a complex right ovarian cyst. The patient has had multiple STDs in the past and states that she thinks she had chlamydia for 2 years without treatment at one point. She is adamant that she wants surgery for her pain. It has affected her enough and is disabling enough that her request for surgical exploration is reasonable. She is also considering pregnancy in the near future and would like to know if her tubes are open.    Hx: The following portions of the patient's history were reviewed and updated as appropriate:             She  has a past medical history of abnormal cervical Pap smear and chlamydia infection. She  does not have any pertinent problems on file. She  has no past surgical history on file. Her family history includes Asthma in her mother; Diabetes in her maternal grandmother; Hyperlipidemia in her maternal grandmother; Hypertension in her maternal grandmother; Migraines in her mother; Rashes / Skin problems in her mother; Thyroid disease in her maternal grandmother. She  reports that she has been smoking Cigarettes.  She has been smoking about 1.00 pack per day. She has never used smokeless tobacco. She reports that she does not drink alcohol or use drugs. She is allergic to latex.       Review of Systems:  Review of Systems  Constitutional: Denied constitutional symptoms, night sweats, recent illness, fatigue, fever, insomnia and weight loss.  Eyes: Denied eye symptoms, eye pain, photophobia, vision change and visual disturbance.  Ears/Nose/Throat/Neck: Denied ear, nose, throat or neck  symptoms, hearing loss, nasal discharge, sinus congestion and sore throat.  Cardiovascular: Denied cardiovascular symptoms, arrhythmia, chest pain/pressure, edema, exercise intolerance, orthopnea and palpitations.  Respiratory: Denied pulmonary symptoms, asthma, pleuritic pain, productive sputum, cough, dyspnea and wheezing.  Gastrointestinal: Denied, gastro-esophageal reflux, melena, nausea and vomiting.  Genitourinary: See HPI for additional information.  Musculoskeletal: Denied musculoskeletal symptoms, stiffness, swelling, muscle weakness and myalgia.  Dermatologic: Denied dermatology symptoms, rash and scar.  Neurologic: Denied neurology symptoms, dizziness, headache, neck pain and syncope.  Psychiatric: Denied psychiatric symptoms, anxiety and depression.  Endocrine: Denied endocrine symptoms including hot flashes and night sweats.   Meds:   No current outpatient prescriptions on file prior to visit.   No current facility-administered medications on file prior to visit.     Objective:     Vitals:   10/11/16 0938  BP: 124/84  Pulse: 73              Ultrasound results reviewed directly with the patient  Assessment:    G1P1001 Patient Active Problem List   Diagnosis Date Noted  . Missed period 05/18/2010  . ASTHMA, EXERCISE INDUCED 04/03/2006  . HEMATURIA 04/03/2006     1. Pelvic pain in female   2. Complex ovarian cyst     Patient with chronic pelvic pain. Possibly pelvic adhesive disease. Patient specifically would like exploration of the right adnexa and oophorectomy. She would like this despite the fact that it may slightly decrease her fertility. She is also strongly considering  chromopertubation.   Plan:            1.  Patient to return for preop. Orders No orders of the defined types were placed in this encounter.   No orders of the defined types were placed in this encounter.     F/U  Return in about 1 week (around 10/18/2016). I spent 22 minutes with  this patient of which greater than 50% was spent discussing chronic pelvic pain, STD history, causes of pelvic pain, pelvic adhesive disease, endometrioma, teratoma, hemorrhagic cyst, future fertility.  Elonda Husky, M.D. 10/11/2016 10:36 AM

## 2016-10-30 ENCOUNTER — Encounter: Payer: Medicaid Other | Admitting: Obstetrics and Gynecology

## 2016-11-12 ENCOUNTER — Encounter: Payer: Medicaid Other | Admitting: Obstetrics and Gynecology

## 2016-11-13 ENCOUNTER — Telehealth: Payer: Self-pay | Admitting: Obstetrics and Gynecology

## 2016-11-13 ENCOUNTER — Encounter: Payer: Self-pay | Admitting: Obstetrics and Gynecology

## 2016-11-13 ENCOUNTER — Ambulatory Visit (INDEPENDENT_AMBULATORY_CARE_PROVIDER_SITE_OTHER): Payer: Medicaid Other | Admitting: Obstetrics and Gynecology

## 2016-11-13 VITALS — BP 117/81 | HR 96 | Ht 65.0 in | Wt 125.6 lb

## 2016-11-13 DIAGNOSIS — Z113 Encounter for screening for infections with a predominantly sexual mode of transmission: Secondary | ICD-10-CM | POA: Diagnosis not present

## 2016-11-13 DIAGNOSIS — R102 Pelvic and perineal pain: Secondary | ICD-10-CM | POA: Diagnosis not present

## 2016-11-13 DIAGNOSIS — N83202 Unspecified ovarian cyst, left side: Secondary | ICD-10-CM | POA: Diagnosis not present

## 2016-11-13 LAB — POCT URINALYSIS DIPSTICK
BILIRUBIN UA: NEGATIVE
Glucose, UA: NEGATIVE
KETONES UA: NEGATIVE
LEUKOCYTES UA: NEGATIVE
Nitrite, UA: NEGATIVE
Protein, UA: NEGATIVE
Spec Grav, UA: 1.01 (ref 1.010–1.025)
Urobilinogen, UA: 0.2 E.U./dL
pH, UA: 6.5 (ref 5.0–8.0)

## 2016-11-13 MED ORDER — NORETHINDRONE ACET-ETHINYL EST 1.5-30 MG-MCG PO TABS: 1.0000 | ORAL_TABLET | Freq: Every day | ORAL | 0 refills | Status: DC

## 2016-11-13 MED ORDER — IBUPROFEN 800 MG PO TABS
800.0000 mg | ORAL_TABLET | Freq: Three times a day (TID) | ORAL | 1 refills | Status: DC | PRN
Start: 2016-11-13 — End: 2017-05-22

## 2016-11-13 MED ORDER — TAPENTADOL HCL ER 50 MG PO TB12: 50.0000 mg | ORAL_TABLET | Freq: Two times a day (BID) | ORAL | 0 refills | Status: DC

## 2016-11-13 NOTE — Patient Instructions (Addendum)
Ovarian Cyst An ovarian cyst is a fluid-filled sac that forms on an ovary. The ovaries are small organs that produce eggs in women. Various types of cysts can form on the ovaries. Some may cause symptoms and require treatment. Most ovarian cysts go away on their own, are not cancerous (are benign), and do not cause problems. Common types of ovarian cysts include:  Functional (follicle) cysts. ? Occur during the menstrual cycle, and usually go away with the next menstrual cycle if you do not get pregnant. ? Usually cause no symptoms.  Endometriomas. ? Are cysts that form from the tissue that lines the uterus (endometrium). ? Are sometimes called "chocolate cysts" because they become filled with blood that turns brown. ? Can cause pain in the lower abdomen during intercourse and during your period.  Cystadenoma cysts. ? Develop from cells on the outside surface of the ovary. ? Can get very large and cause lower abdomen pain and pain with intercourse. ? Can cause severe pain if they twist or break open (rupture).  Dermoid cysts. ? Are sometimes found in both ovaries. ? May contain different kinds of body tissue, such as skin, teeth, hair, or cartilage. ? Usually do not cause symptoms unless they get very big.  Theca lutein cysts. ? Occur when too much of a certain hormone (human chorionic gonadotropin) is produced and overstimulates the ovaries to produce an egg. ? Are most common after having procedures used to assist with the conception of a baby (in vitro fertilization).  What are the causes? Ovarian cysts may be caused by:  Ovarian hyperstimulation syndrome. This is a condition that can develop from taking fertility medicines. It causes multiple large ovarian cysts to form.  Polycystic ovarian syndrome (PCOS). This is a common hormonal disorder that can cause ovarian cysts, as well as problems with your period or fertility.  What increases the risk? The following factors may make  you more likely to develop ovarian cysts:  Being overweight or obese.  Taking fertility medicines.  Taking certain forms of hormonal birth control.  Smoking.  What are the signs or symptoms? Many ovarian cysts do not cause symptoms. If symptoms are present, they may include:  Pelvic pain or pressure.  Pain in the lower abdomen.  Pain during sex.  Abdominal swelling.  Abnormal menstrual periods.  Increasing pain with menstrual periods.  How is this diagnosed? These cysts are commonly found during a routine pelvic exam. You may have tests to find out more about the cyst, such as:  Ultrasound.  X-ray of the pelvis.  CT scan.  MRI.  Blood tests.  How is this treated? Many ovarian cysts go away on their own without treatment. Your health care provider may want to check your cyst regularly for 2-3 months to see if it changes. If you are in menopause, it is especially important to have your cyst monitored closely because menopausal women have a higher rate of ovarian cancer. When treatment is needed, it may include:  Medicines to help relieve pain.  A procedure to drain the cyst (aspiration).  Surgery to remove the whole cyst.  Hormone treatment or birth control pills. These methods are sometimes used to help dissolve a cyst.  Follow these instructions at home:  Take over-the-counter and prescription medicines only as told by your health care provider.  Do not drive or use heavy machinery while taking prescription pain medicine.  Get regular pelvic exams and Pap tests as often as told by your health care   provider.  Return to your normal activities as told by your health care provider. Ask your health care provider what activities are safe for you.  Do not use any products that contain nicotine or tobacco, such as cigarettes and e-cigarettes. If you need help quitting, ask your health care provider.  Keep all follow-up visits as told by your health care provider.  This is important. Contact a health care provider if:  Your periods are late, irregular, or painful, or they stop.  You have pelvic pain that does not go away.  You have pressure on your bladder or trouble emptying your bladder completely.  You have pain during sex.  You have any of the following in your abdomen: ? A feeling of fullness. ? Pressure. ? Discomfort. ? Pain that does not go away. ? Swelling.  You feel generally ill.  You become constipated.  You lose your appetite.  You develop severe acne.  You start to have more body hair and facial hair.  You are gaining weight or losing weight without changing your exercise and eating habits.  You think you may be pregnant. Get help right away if:  You have abdominal pain that is severe or gets worse.  You cannot eat or drink without vomiting.  You suddenly develop a fever.  Your menstrual period is much heavier than usual. This information is not intended to replace advice given to you by your health care provider. Make sure you discuss any questions you have with your health care provider. Document Released: 01/21/2005 Document Revised: 08/11/2015 Document Reviewed: 06/25/2015 Elsevier Interactive Patient Education  2018 [employer] Pelvic Pain, Female Pelvic pain is pain in your lower abdomen, below your belly button and between your hips. The pain may start suddenly (acute), keep coming back (recurring), or last a long time (chronic). Pelvic pain that lasts longer than six months is considered chronic. Pelvic pain may affect your:  Reproductive organs.  Urinary system.  Digestive tract.  Musculoskeletal system.  There are many potential causes of pelvic pain. Sometimes, the pain can be a result of digestive or urinary conditions, strained muscles or ligaments, or even reproductive conditions. Sometimes the cause of pelvic pain is not known. Follow these instructions at home:  Take over-the-counter and  prescription medicines only as told by your health care provider.  Rest as told by your health care provider.  Do not have sex it if hurts.  Keep a journal of your pelvic pain. Write down: ? When the pain started. ? Where the pain is located. ? What seems to make the pain better or worse, such as food or your menstrual cycle. ? Any symptoms you have along with the pain.  Keep all follow-up visits as told by your health care provider. This is important. Contact a health care provider if:  Medicine does not help your pain.  Your pain comes back.  You have new symptoms.  You have abnormal vaginal discharge or bleeding, including bleeding after menopause.  You have a fever or chills.  You are constipated.  You have blood in your urine or stool.  You have foul-smelling urine.  You feel weak or lightheaded. Get help right away if:  You have sudden severe pain.  Your pain gets steadily worse.  You have severe pain along with fever, nausea, vomiting, or excessive sweating.  You lose consciousness. This information is not intended to replace advice given to you by your health care provider. Make sure you discuss any questions   you have with your health care provider. Document Released: 12/19/2003 Document Revised: 02/15/2015 Document Reviewed: 11/11/2014 Elsevier Interactive Patient Education  2018 [employer]vier Inc.  

## 2016-11-13 NOTE — Progress Notes (Signed)
GYNECOLOGY PROGRESS NOTE  Subjective:    Patient ID: Christina Barber, female    DOB: 09-30-1990, 26 y.o.   MRN: [id number]  HPI  Patient is a 26 y.o. G21P1001 female who presents for second opinion regarding her pelvic pain and ovarian cyst.  Patient was seen by Dr. Brennan Bailey of [facility] ~ 1 month ago regarding her symptoms.  Due to her history of recurrent STI's and her current complex right ovarian cyst, patient states that it was recommended that she have surgery.  Patient notes that she was ok with the surgery at first, but states she became concerned when it was noted that she might have to have removal of an ovary.  Patient reports that this upset her, as she is likely considering conception sometime soon.  Desired to have a second opinion regarding this. She is still noting continued pain with certain movements and with intercourse. This has been ongoing for several years. Notes that she has been taking Tylenol and Ibuprofen without relief.   The following portions of the patient's history were reviewed and updated as appropriate: allergies, current medications, past family history, past medical history, past social history, past surgical history and problem list.  Review of Systems Pertinent items noted in HPI and remainder of comprehensive ROS otherwise negative.   Objective:   Blood pressure 117/81, pulse 96, height  (1.651 m), weight 125 lb 9.6 oz (57 kg), last menstrual period 10/26/2016. General appearance: alert and no distress Abdomen: normal findings: no masses palpable and soft and abnormal findings:  mild-moderate tenderness in the lower abdomen Pelvic: external genitalia normal, rectovaginal septum normal.  Vagina with scant thin white discharge.  Cervix normal appearing, no lesions.  + CMT.  Uterus mobile, nontender, normal shape and size.  Adnexae non-palpable, nontender bilaterally.    Imaging:  ULTRASOUND REPORT  Location: [facility] Date of  Service: 09/16/16   Indications: Pelvic pain and Dyspareunia Findings:  The uterus is anteverted and  measures 9.6 x 4.6 x 5.9 cm. Echo texture is overall without evidence of focal masses, except for a small area on the anterior fundus. At the anterior uterine fundus, there is a suspected fibroid measuring: Fibroid 1: 1.0 x 0.7 x 1.1 cm. This is subserosal.  The Endometrium is thickened and measures 11.0 mm.  Right Ovary measures 4.2 x 3.6 x 4.0 cm. There is a complex mass that is partly cystic and partly solid. There is flow seen in the solid component of the mass.   Left Ovary measures 3.7 x 2.1 x 2.6 cm. It appears to be WNL. Survey of the adnexa demonstrates no adnexal masses. There is no free fluid in the cul de sac.  Impression: 1. Anterior fundal fibroid, subserosal.  2. Right complex ovarian mass, cystic with solid components with internal blood flow seen. (Patient states that she has just finished antibiotics this past Friday, 09/13/16, for P.I.D. ) Assessment:   Chronic pelvic pain Right ovarian cyst STD testing Plan:   -  Patient with chronic pelvic pain, possibly pelvic adhesive disease due to h/o multiple STI history. Patient would like to consider surgical exploration, however is concerned regarding losing an ovary due to her ovarian cyst. Discussed with patient that the ovarian cyst based on her last ultrasound was small, but with cystic and solid components. Discussed how depending on the nature of the cyst that if it is very involved with then ovary that there sometimes is difficulty in separating the cyst  from the ovary, so it is possible to lose a portion or all of 1 ovary. Discussed that despite this, her fertility chances would remain approximately the same even with 1 ovary, however the goal would be to remove the cyst only.  Patient notes understanding.  - Discussed alternative option with patient regarding management of the cyst, including the fact that she was  treated for suspected PID approximately around the time the ultrasound was performed, and can probably use OCPs for 4-6 weeks to help the cyst resolve.  Patient notes she would like to try this first, prior to attempting surgery. She will return in 4-6 weeks for an ultrasound and f/u with me.  If cyst still present, patient ok to undergo surgery with removal at that time. Prescribed Junel  - Also revisited discussion of performing chromotubation at time of surgery to assess fertility if PID is diagnosed.  Otherwise, if cyst and resolves and patient decides not to proceed with surgery, she could have an HSG performed.  - Patient desires something for pain as current meds are not working.  Will prescribe short supply of T#3 for moderate pain (notes she has tried Tramadol in the past for pain and did not work for her).  - Patient desired STD testing (GC/Cl) to be performed today during exam.  Lab sent.  Has had recent testing ~ 2 months ago as well.   - To f/u in 6 weeks.    A total of 25 minutes were spent face-to-face with the patient during this encounter and over half of that time involved counseling and coordination of care.    Hildred Laser, MD [facility]

## 2016-11-14 ENCOUNTER — Telehealth: Payer: Self-pay | Admitting: Obstetrics and Gynecology

## 2016-11-14 MED ORDER — ACETAMINOPHEN-CODEINE #3 300-30 MG PO TABS: 1.0000 | ORAL_TABLET | ORAL | 0 refills | Status: AC | PRN

## 2016-11-14 NOTE — Telephone Encounter (Addendum)
Spoke with pt she states she took rx to [facility] on [address] and they told her rx needed PA. Called pharmacy and verified information. Contacted insurance and they stated this could be done via fax. Looked at [facility] preferred drug list and Nucynta is non preferred. Spoke with Dr. Valentino Saxon and gave the verbal to change for Tylenol 3. Rx printed and signed. Pt was notified that rx is placed up front for pick up. She had no additional questions at this time.

## 2016-11-14 NOTE — Telephone Encounter (Signed)
Error

## 2016-11-14 NOTE — Telephone Encounter (Signed)
Patient states that she was expecting a call back yesterday concerning her pain medication and hasn't heard from anyone.  Please call

## 2016-11-15 LAB — GC/CHLAMYDIA PROBE AMP
CHLAMYDIA, DNA PROBE: NEGATIVE
Neisseria gonorrhoeae by PCR: NEGATIVE

## 2016-12-25 ENCOUNTER — Ambulatory Visit (INDEPENDENT_AMBULATORY_CARE_PROVIDER_SITE_OTHER): Payer: Medicaid Other

## 2016-12-25 DIAGNOSIS — N83202 Unspecified ovarian cyst, left side: Secondary | ICD-10-CM

## 2016-12-31 ENCOUNTER — Ambulatory Visit (INDEPENDENT_AMBULATORY_CARE_PROVIDER_SITE_OTHER): Payer: Medicaid Other | Admitting: Obstetrics and Gynecology

## 2016-12-31 ENCOUNTER — Encounter: Payer: Self-pay | Admitting: Obstetrics and Gynecology

## 2016-12-31 VITALS — BP 147/92 | HR 83 | Ht 65.0 in | Wt 126.8 lb

## 2016-12-31 DIAGNOSIS — E282 Polycystic ovarian syndrome: Secondary | ICD-10-CM

## 2016-12-31 DIAGNOSIS — N979 Female infertility, unspecified: Secondary | ICD-10-CM | POA: Diagnosis not present

## 2016-12-31 DIAGNOSIS — R102 Pelvic and perineal pain: Secondary | ICD-10-CM

## 2016-12-31 NOTE — Progress Notes (Signed)
    GYNECOLOGY PROGRESS NOTE  Subjective:    Patient ID: Christina Barber, female    DOB: Apr 30, 1990, 26 y.o.   MRN: [id number]  HPI  Patient is a 26 y.o. 351P1001 female who presents for follow up after ultrasound results and for further management of pelvic pain and fertility concerns.  Ultrasound was performed to assess potential causes of pain and also f/u previous right ovarian cyst.  Patient reports that she took the birth control as prescribed to treat the cyst, but notes that it messed up her cycle, which came on and lasted for 2 weeks.   Patient still noting some pain, notes that T#3 sometimes barely touches her pain.   The following portions of the patient's history were reviewed and updated as appropriate: allergies, current medications, past family history, past medical history, past social history, past surgical history and problem list.  Review of Systems Pertinent items noted in HPI and remainder of comprehensive ROS otherwise negative.   Objective:   Blood pressure (!) 147/92, pulse 83, height 5\' 5"  (1.651 m), weight 126 lb 12.8 oz (57.5 kg), last menstrual period 12/06/2016. General appearance: alert and no distress Remainder of exam deferred.     Imaging:  ULTRASOUND REPORT  Location: [facility] Date of Service:   12/25/16   Indications: F/U Right Ovarian Cyst Findings:  The uterus measures 10.2 x 4.9 x 4.4 cm. Echo texture is homogeneous without evidence of focal masses. The Endometrium measures 13.4 mm and appears trilaminar.  Right Ovary measures 3.0 x 2.2 x 1.8 cm and has a PCOS appearance.  Multiple follicles along the periphery of the ovary are noted. Left Ovary measures 3.0 x 2.2 x 1.9 cm and appears WNL.  Survey of the adnexa demonstrates no adnexal masses. There is no free fluid in the cul de sac.  Impression: 1. Anteverted uterus appears of normal size and contour. 2. Endometrium measures 13.4 mm and appears trilaminar. 3. Right  ovary has PCOS appearance with multiple follicles along the periphery.  Recommendations: 1.Clinical correlation with the patient's History and Physical Exam.   Kari BaarsJill Long, RDMS   Assessment:   PCOS? Pelvic pain in female Infertility  Plan:   - Discussed potential PCOS diagnosis based on ultrasound results.  Discussed PCOS in the setting of fertility.  Patient does note overall normal periods.  Will check labs to confirm diagnosis. Will also assess for potential ovulation this month with progesterone level. As patient is after Day 21.  - Discussed pelvic pain.  Patient notes that she is now ready to proceed with scheduling surgery to assess for cause of pelvic pain.  Can also perform chromotubation at same time to assess for patency of tubes due to h/o STDs in the past.  Patient would like to schedule surgery for next available. Will schedule for Feb 10, 2017.  To return in 4 weeks for pre-op.    A total of 15 minutes were spent face-to-face with the patient during this encounter and over half of that time dealt with counseling and coordination of care.   Hildred Laserherry, Eiden Bagot, MD [facility]

## 2017-01-05 LAB — LIPID PANEL
CHOLESTEROL TOTAL: 184 mg/dL (ref 100–199)
Chol/HDL Ratio: 3.1 ratio (ref 0.0–4.4)
HDL: 60 mg/dL (ref >39)
LDL Calculated: 107 mg/dL — ABNORMAL HIGH (ref 0–99)
Triglycerides: 84 mg/dL (ref 0–149)
VLDL Cholesterol Cal: 17 mg/dL (ref 5–40)

## 2017-01-05 LAB — INSULIN, FREE AND TOTAL
Free Insulin: 9 uU/mL
Total Insulin: 9.8 uU/mL

## 2017-01-05 LAB — TESTOSTERONE, FREE, TOTAL, SHBG
Sex Hormone Binding: 180.8 nmol/L — ABNORMAL HIGH (ref 24.6–122.0)
Testosterone, Free: 0.7 pg/mL (ref 0.0–4.2)
Testosterone: 25 ng/dL (ref 8–48)

## 2017-01-05 LAB — FSH/LH
FSH: 6.2 m[IU]/mL
LH: 9.5 m[IU]/mL

## 2017-01-05 LAB — PROGESTERONE: Progesterone: 28.1 ng/mL

## 2017-01-05 LAB — ESTRADIOL: ESTRADIOL: 145.9 pg/mL

## 2017-01-10 ENCOUNTER — Encounter: Payer: Self-pay | Admitting: Obstetrics and Gynecology

## 2017-01-31 ENCOUNTER — Inpatient Hospital Stay: Admission: RE | Admit: 2017-01-31 | Payer: Self-pay | Source: Ambulatory Visit

## 2017-01-31 ENCOUNTER — Encounter: Payer: Medicaid Other | Admitting: Obstetrics and Gynecology

## 2017-02-05 ENCOUNTER — Inpatient Hospital Stay: Admission: RE | Admit: 2017-02-05 | Payer: Self-pay | Source: Ambulatory Visit

## 2017-02-05 ENCOUNTER — Other Ambulatory Visit: Payer: Self-pay

## 2017-02-11 ENCOUNTER — Encounter: Payer: Medicaid Other | Admitting: Obstetrics and Gynecology

## 2017-02-12 ENCOUNTER — Inpatient Hospital Stay: Admission: RE | Admit: 2017-02-12 | Payer: Self-pay | Source: Ambulatory Visit

## 2017-02-17 ENCOUNTER — Ambulatory Visit: Admission: RE | Admit: 2017-02-17 | Payer: Self-pay | Source: Ambulatory Visit | Admitting: Obstetrics and Gynecology

## 2017-02-17 ENCOUNTER — Encounter: Admission: RE | Payer: Self-pay | Source: Ambulatory Visit

## 2017-02-17 SURGERY — LAPAROSCOPY, DIAGNOSTIC
Anesthesia: General

## 2017-03-10 ENCOUNTER — Encounter: Payer: Self-pay | Admitting: Obstetrics and Gynecology

## 2017-03-17 ENCOUNTER — Encounter: Payer: Self-pay | Admitting: Obstetrics and Gynecology

## 2017-03-31 ENCOUNTER — Encounter: Payer: Self-pay | Admitting: Obstetrics & Gynecology

## 2017-03-31 ENCOUNTER — Ambulatory Visit (INDEPENDENT_AMBULATORY_CARE_PROVIDER_SITE_OTHER): Payer: Medicaid Other | Admitting: Obstetrics & Gynecology

## 2017-03-31 VITALS — BP 140/88 | HR 62 | Ht 65.0 in | Wt 122.5 lb

## 2017-03-31 DIAGNOSIS — Z8619 Personal history of other infectious and parasitic diseases: Secondary | ICD-10-CM

## 2017-03-31 DIAGNOSIS — N979 Female infertility, unspecified: Secondary | ICD-10-CM

## 2017-03-31 NOTE — Progress Notes (Signed)
Chief Complaint  Patient presents with  . discuss having surgery    discuss test results and US from [place]      27 y.o. G1P1001 Patient's last menstrual period was 03/11/2017. The current method of family planning is none.  Outpatient Encounter Medications as of 03/31/2017  Medication Sig  . acetaminophen-codeine (TYLENOL #3) 300-30 MG tablet Take 1-2 tablets by mouth every 4 (four) hours as needed for moderate pain.  Marland Kitchen. ibuprofen (ADVIL,MOTRIN) 800 MG tablet Take 1 tablet (800 mg total) by mouth every 8 (eight) hours as needed. (Patient taking differently: Take 800 mg by mouth every 8 (eight) hours as needed for mild pain. )  . [DISCONTINUED] Norethindrone Acetate-Ethinyl Estradiol (JUNEL 1.5/30) 1.5-30 MG-MCG tablet Take 1 tablet by mouth daily. Take active pills once daily for 6 weeks. (Patient not taking: Reported on 12/31/2016)   No facility-administered encounter medications on file as of 03/31/2017.     Subjective Christina HippJessica R Barber Christina 27 year old female gravida 1 para 1 is stated above who is seen today as an initial visit because wanting to try to get pregnant There was some confusion initially over why she was here Moved to this area and thought that maybe she was being followed up because of a cyst seen on ultrasound where she was seen at 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 Hx of abnormal cervical Pap smear   . Hx of chlamydia infection     History reviewed. No pertinent surgical  history.  OB History    Gravida Para Term Preterm AB Living   1 1 1     1    SAB TAB Ectopic Multiple Live Births           1      Allergies  Allergen Reactions  . Latex Hives    Social History   Socioeconomic History  . Marital status: Single    Spouse name: None  . Number of children: None  . Years of education: None  . Highest education level: None  Social Needs  . Financial resource strain: None  . Food insecurity - worry: None  . Food insecurity - inability: None  . Transportation needs - medical: None  . Transportation needs - non-medical: None  Occupational History  . None  Tobacco Use  . Smoking status: Current Every Day Smoker    Packs/day: 0.00    Years: 2.00    Pack years: 0.00    Types: Cigarettes  . Smokeless tobacco: Never Used  . Tobacco comment: smokes 5 cig daily  Substance and Sexual Activity  . Alcohol use: No  . Drug use: No  . Sexual activity: Yes    Partners: Male    Birth control/protection: None  Other Topics Concern  . None  Social History Narrative  . None    Family History  Problem Relation Age of Onset  . Asthma Mother   . Migraines Mother   . Rashes / Skin problems Mother   . Diabetes Maternal Grandmother   .  Hyperlipidemia Maternal Grandmother   . Hypertension Maternal Grandmother   . Thyroid disease Maternal Grandmother   . Other Maternal Grandmother        colon issues    Medications:       Current Outpatient Medications:  .  acetaminophen-codeine (TYLENOL #3) 300-30 MG tablet, Take 1-2 tablets by mouth every 4 (four) hours as needed for moderate pain., Disp: 30 tablet, Rfl: 0 .  ibuprofen (ADVIL,MOTRIN) 800 MG tablet, Take 1 tablet (800 mg total) by mouth every 8 (eight) hours as needed. (Patient taking differently: Take 800 mg by mouth every 8 (eight) hours as needed for mild pain. ), Disp: 60 tablet, Rfl: 1  Objective Blood pressure 140/88, pulse 62, height 5\' 5"  (1.651 m), weight 122 lb 8 oz (55.6 kg), last  menstrual period 03/11/2017.  Gen developed well-nourished female no acute distress  Pertinent ROS No burning with urination, frequency or urgency No nausea, vomiting or diarrhea Nor fever chills or other constitutional symptoms   Labs or studies Reviewed her ultrasounds and labs from her care at [facility] [place]    Impression Diagnoses this Encounter::   ICD-10-CM   1. 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 History of chlamydia Z86.19     Established relevant diagnosis(es):   Plan/Recommendations: No orders of the defined types were placed in this encounter.   Labs or Scans Ordered: No orders of the defined types were placed in this encounter.   Management:: We discussed at length fertility issues especially as related to tubal infertility the possibility of IVF if indeed there was a proximal occlusion and possible surgery where the distal occlusion As it stands now she will call with her starts her menses so we can schedule hysterosalpingogram  Follow up Return if symptoms worsen or fail to improve.       Face to face time:  20 minutes  Greater than 50% of the visit time was spent in counseling and coordination of care with the patient.  The summary and outline of the counseling and care coordination is summarized in the note above.   All questions were answered.   All questions were answered.

## 2017-04-03 ENCOUNTER — Other Ambulatory Visit: Payer: Self-pay

## 2017-04-14 ENCOUNTER — Telehealth: Payer: Self-pay | Admitting: Obstetrics & Gynecology

## 2017-04-14 ENCOUNTER — Other Ambulatory Visit: Payer: Self-pay | Admitting: *Deleted

## 2017-04-14 DIAGNOSIS — N979 Female infertility, unspecified: Secondary | ICD-10-CM

## 2017-04-14 NOTE — Telephone Encounter (Signed)
Patient states she wants to proceed with the HSG. Started her cycle on 4/9. Please advise.

## 2017-04-14 NOTE — Telephone Encounter (Signed)
Patient scheduled for HSG on 3/18 @ 9am.

## 2017-04-14 NOTE — Telephone Encounter (Signed)
It is not a surgery.  It is an HSG, she was to call when she started her period, please inquire first day of LMP and I will let you know the date it can be done

## 2017-04-16 ENCOUNTER — Encounter: Payer: Self-pay | Admitting: *Deleted

## 2017-04-17 ENCOUNTER — Telehealth: Payer: Self-pay | Admitting: *Deleted

## 2017-04-17 NOTE — Telephone Encounter (Signed)
HSG scheduled for 3/19 @ 9am [facility] Patient aware.

## 2017-04-20 ENCOUNTER — Encounter: Payer: Self-pay | Admitting: Obstetrics & Gynecology

## 2017-04-21 ENCOUNTER — Ambulatory Visit (HOSPITAL_COMMUNITY): Payer: Medicaid Other

## 2017-04-22 ENCOUNTER — Ambulatory Visit (HOSPITAL_COMMUNITY)
Admission: RE | Admit: 2017-04-22 | Discharge: 2017-04-22 | Disposition: A | Discharge status: Home / Self Care | Payer: Medicaid Other | Source: Ambulatory Visit | Attending: Obstetrics & Gynecology | Admitting: Obstetrics & Gynecology

## 2017-04-22 DIAGNOSIS — N971 Female infertility of tubal origin: Secondary | ICD-10-CM | POA: Diagnosis not present

## 2017-04-22 DIAGNOSIS — N979 Female infertility, unspecified: Secondary | ICD-10-CM | POA: Diagnosis present

## 2017-04-22 MED ORDER — IOPAMIDOL (ISOVUE-300) INJECTION 61%
INTRAVENOUS | Status: AC
  Administered 2017-04-22: 18 mL
  Filled 2017-04-22: qty 30

## 2017-04-22 MED ORDER — POVIDONE-IODINE 10 % EX SOLN
CUTANEOUS | Status: AC
  Filled 2017-04-22: qty 15

## 2017-05-22 ENCOUNTER — Ambulatory Visit (INDEPENDENT_AMBULATORY_CARE_PROVIDER_SITE_OTHER): Payer: Medicaid Other | Admitting: Obstetrics & Gynecology

## 2017-05-22 ENCOUNTER — Encounter: Payer: Self-pay | Admitting: Obstetrics & Gynecology

## 2017-05-22 ENCOUNTER — Other Ambulatory Visit: Payer: Self-pay

## 2017-05-22 VITALS — BP 122/86 | HR 99 | Ht 65.0 in | Wt 123.0 lb

## 2017-05-22 DIAGNOSIS — N941 Unspecified dyspareunia: Secondary | ICD-10-CM | POA: Diagnosis not present

## 2017-05-22 DIAGNOSIS — N979 Female infertility, unspecified: Secondary | ICD-10-CM

## 2017-05-22 DIAGNOSIS — N971 Female infertility of tubal origin: Secondary | ICD-10-CM | POA: Diagnosis not present

## 2017-05-22 MED ORDER — IBUPROFEN 800 MG PO TABS: 800.0000 mg | ORAL_TABLET | Freq: Three times a day (TID) | ORAL | 1 refills | Status: AC | PRN

## 2017-05-22 NOTE — Progress Notes (Signed)
Chief Complaint  Patient presents with  . Follow-up      27 y.o. G1P1001 Patient's last menstrual period was 05/12/2017. The current method of family planning is none.  Outpatient Encounter Medications as of 05/22/2017  Medication Sig  . acetaminophen-codeine (TYLENOL #3) 300-30 MG tablet Take 1-2 tablets by mouth every 4 (four) hours as needed for moderate pain.  Marland Kitchen. ibuprofen (ADVIL,MOTRIN) 800 MG tablet Take 1 tablet (800 mg total) by mouth every 8 (eight) hours as needed for mild pain.  . [DISCONTINUED] ibuprofen (ADVIL,MOTRIN) 800 MG tablet Take 1 tablet (800 mg total) by mouth every 8 (eight) hours as needed. (Patient taking differently: Take 800 mg by mouth every 8 (eight) hours as needed for mild pain. )   No facility-administered encounter medications on file as of 05/22/2017.     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 Hx of abnormal cervical Pap smear   . Hx of chlamydia infection     History reviewed. No pertinent surgical history.  OB History    Gravida  1   Para  1   Term  1   Preterm      AB      Living  1     SAB      TAB      Ectopic      Multiple      Live Births  1           Allergies  Allergen Reactions  . Latex Hives    Social History   Socioeconomic History  . Marital status: Single    Spouse name: Not on file  . Number of children: Not on file  . Years of education: Not on file  . Highest education level: Not on file  Occupational History  . Not on file  Social Needs  . Financial resource strain: Not on file  . Food insecurity:    Worry: Not on file    Inability: Not on file  . Transportation needs:    Medical: Not on file    Non-medical: Not on file  Tobacco Use  . Smoking status: Current Every Day Smoker    Packs/day: 0.25    Years: 2.00    Pack years: 0.50    Types: Cigarettes  . Smokeless tobacco:  Never Used  . Tobacco comment: smokes 5 cig daily  Substance and Sexual Activity  . Alcohol use: No  . Drug use: No  . Sexual activity: Yes    Partners: Male    Birth control/protection: None  Lifestyle  . Physical activity:    Days per week: Not on file    Minutes per session: Not on file  . Stress: Not on file  Relationships  . Social connections:    Talks on phone: Not on file    Gets together: Not on file    Attends religious service: Not on file    Active member of club or organization: Not on file    Attends meetings of clubs or organizations: Not on file    Relationship status: Not on file  Other Topics Concern  . Not on file  Social History Narrative  . Not on file    Family History  Problem Relation Age of Onset  . Asthma Mother   . Migraines Mother   . Rashes / Skin problems Mother   . Diabetes  Maternal Grandmother   . Hyperlipidemia Maternal Grandmother   . Hypertension Maternal Grandmother   . Thyroid disease Maternal Grandmother   . Other Maternal Grandmother        colon issues    Medications:       Current Outpatient Medications:  .  acetaminophen-codeine (TYLENOL #3) 300-30 MG tablet, Take 1-2 tablets by mouth every 4 (four) hours as needed for moderate pain., Disp: 30 tablet, Rfl: 0 .  ibuprofen (ADVIL,MOTRIN) 800 MG tablet, Take 1 tablet (800 mg total) by mouth every 8 (eight) hours as needed for mild pain., Disp: 60 tablet, Rfl: 1  Objective Blood pressure 122/86, pulse 99, height 5\' 5"  (1.651 m), weight 123 lb (55.8 kg), last menstrual period 05/12/2017.  2  Pertinent ROS   Labs or studies     Impression Diagnoses this Encounter::   ICD-10-CM   1. 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 Dyspareunia in female N94.10     Established relevant diagnosis(es):   Plan/Recommendations: Meds ordered this encounter  Medications  . ibuprofen (ADVIL,MOTRIN) 800 MG tablet    Sig: Take 1 tablet (800  mg total) by mouth every 8 (eight) hours as needed for mild pain.    Dispense:  60 tablet    Refill:  1    Labs or Scans Ordered: No orders of the defined types were placed in this encounter.   Management:: Referred to [facility] for tubal factor infertility  Follow up Return if symptoms worsen or fail to improve.        Face to face time:  10 minutes  Greater than 50% of the visit time was spent in counseling and coordination of care with the patient.  The summary and outline of the counseling and care coordination is summarized in the note above.   All questions were answered.
